# Patient Record
Sex: Female | Born: 1940 | Race: White | Hispanic: No | Smoking: Never smoker
Health system: Southern US, Community
[De-identification: ages and names within clinical notes are randomized; demographics above are authoritative.]

## PROBLEM LIST (undated history)

## (undated) DIAGNOSIS — F32A Depression, unspecified: Secondary | ICD-10-CM

## (undated) DIAGNOSIS — N83209 Unspecified ovarian cyst, unspecified side: Secondary | ICD-10-CM

## (undated) DIAGNOSIS — I420 Dilated cardiomyopathy: Secondary | ICD-10-CM

## (undated) DIAGNOSIS — K5792 Diverticulitis of intestine, part unspecified, without perforation or abscess without bleeding: Secondary | ICD-10-CM

## (undated) DIAGNOSIS — T148XXA Other injury of unspecified body region, initial encounter: Secondary | ICD-10-CM

## (undated) DIAGNOSIS — I499 Cardiac arrhythmia, unspecified: Secondary | ICD-10-CM

## (undated) DIAGNOSIS — I517 Cardiomegaly: Secondary | ICD-10-CM

## (undated) DIAGNOSIS — I878 Other specified disorders of veins: Secondary | ICD-10-CM

## (undated) DIAGNOSIS — E785 Hyperlipidemia, unspecified: Secondary | ICD-10-CM

## (undated) DIAGNOSIS — M810 Age-related osteoporosis without current pathological fracture: Secondary | ICD-10-CM

## (undated) DIAGNOSIS — D649 Anemia, unspecified: Secondary | ICD-10-CM

## (undated) DIAGNOSIS — Z974 Presence of external hearing-aid: Secondary | ICD-10-CM

## (undated) DIAGNOSIS — M4800 Spinal stenosis, site unspecified: Secondary | ICD-10-CM

## (undated) DIAGNOSIS — F329 Major depressive disorder, single episode, unspecified: Secondary | ICD-10-CM

## (undated) DIAGNOSIS — I10 Essential (primary) hypertension: Secondary | ICD-10-CM

## (undated) DIAGNOSIS — K529 Noninfective gastroenteritis and colitis, unspecified: Secondary | ICD-10-CM

## (undated) DIAGNOSIS — Z95810 Presence of automatic (implantable) cardiac defibrillator: Secondary | ICD-10-CM

## (undated) DIAGNOSIS — K219 Gastro-esophageal reflux disease without esophagitis: Secondary | ICD-10-CM

## (undated) DIAGNOSIS — E010 Iodine-deficiency related diffuse (endemic) goiter: Secondary | ICD-10-CM

## (undated) DIAGNOSIS — M1990 Unspecified osteoarthritis, unspecified site: Secondary | ICD-10-CM

## (undated) HISTORY — PX: PACEMAKER PLACEMENT: SHX43

## (undated) HISTORY — PX: CARDIAC DEFIBRILLATOR PLACEMENT: SHX171

---

## 2003-09-14 ENCOUNTER — Other Ambulatory Visit: Payer: Self-pay

## 2005-03-19 ENCOUNTER — Ambulatory Visit: Payer: Self-pay | Admitting: Internal Medicine

## 2006-04-23 ENCOUNTER — Ambulatory Visit: Payer: Self-pay | Admitting: Internal Medicine

## 2006-06-22 ENCOUNTER — Other Ambulatory Visit: Payer: Self-pay

## 2006-06-22 ENCOUNTER — Emergency Department: Payer: Self-pay | Admitting: Emergency Medicine

## 2007-03-06 ENCOUNTER — Ambulatory Visit: Payer: Self-pay | Admitting: Internal Medicine

## 2007-04-25 ENCOUNTER — Ambulatory Visit: Payer: Self-pay | Admitting: Internal Medicine

## 2008-02-06 ENCOUNTER — Emergency Department: Payer: Self-pay | Admitting: Emergency Medicine

## 2008-02-06 ENCOUNTER — Other Ambulatory Visit: Payer: Self-pay

## 2008-03-19 ENCOUNTER — Ambulatory Visit: Payer: Self-pay | Admitting: Internal Medicine

## 2008-04-27 ENCOUNTER — Ambulatory Visit: Payer: Self-pay | Admitting: Internal Medicine

## 2009-04-29 ENCOUNTER — Ambulatory Visit: Payer: Self-pay | Admitting: Internal Medicine

## 2009-09-06 ENCOUNTER — Encounter: Admission: RE | Admit: 2009-09-06 | Discharge: 2009-09-06 | Payer: Self-pay | Admitting: Orthopedic Surgery

## 2009-09-30 ENCOUNTER — Ambulatory Visit: Payer: Self-pay | Admitting: Internal Medicine

## 2010-05-02 ENCOUNTER — Ambulatory Visit: Payer: Self-pay | Admitting: Internal Medicine

## 2010-05-22 ENCOUNTER — Ambulatory Visit: Payer: Self-pay | Admitting: Cardiology

## 2010-05-26 ENCOUNTER — Ambulatory Visit: Payer: Self-pay | Admitting: Cardiology

## 2011-05-04 ENCOUNTER — Ambulatory Visit: Payer: Self-pay | Admitting: Internal Medicine

## 2011-06-21 ENCOUNTER — Ambulatory Visit: Payer: Self-pay | Admitting: Internal Medicine

## 2012-02-01 ENCOUNTER — Ambulatory Visit: Payer: Self-pay | Admitting: Internal Medicine

## 2012-02-01 LAB — CBC CANCER CENTER
Basophil %: 1.1 %
Comment - H1-Com1: NORMAL
Comment - H1-Com2: NORMAL
Eosinophil: 4 %
HCT: 31.4 % — ABNORMAL LOW (ref 35.0–47.0)
Lymphocyte #: 0.9 x10 3/mm — ABNORMAL LOW (ref 1.0–3.6)
Lymphocyte %: 21 %
MCHC: 33.8 g/dL (ref 32.0–36.0)
Monocyte #: 0.6 x10 3/mm (ref 0.2–0.9)
Neutrophil #: 2.6 x10 3/mm (ref 1.4–6.5)
Neutrophil %: 61.2 %
Platelet: 219 x10 3/mm (ref 150–440)
RDW: 13.6 % (ref 11.5–14.5)
Segmented Neutrophils: 59 %

## 2012-02-01 LAB — FOLATE: Folic Acid: 33.9 ng/mL (ref 3.1–100.0)

## 2012-02-01 LAB — RETICULOCYTES
Absolute Retic Count: 0.0329 10*6/uL (ref 0.024–0.084)
Reticulocyte: 0.97 % (ref 0.5–1.5)

## 2012-02-01 LAB — LACTATE DEHYDROGENASE: LDH: 296 U/L — ABNORMAL HIGH (ref 84–246)

## 2012-02-01 LAB — CREATININE, SERUM
Creatinine: 1.22 mg/dL (ref 0.60–1.30)
EGFR (African American): 51 — ABNORMAL LOW
EGFR (Non-African Amer.): 44 — ABNORMAL LOW

## 2012-02-04 LAB — URINE IEP, RANDOM

## 2012-02-04 LAB — PROT IMMUNOELECTROPHORES(ARMC)

## 2012-02-13 ENCOUNTER — Ambulatory Visit: Payer: Self-pay | Admitting: Internal Medicine

## 2012-03-15 ENCOUNTER — Ambulatory Visit: Payer: Self-pay | Admitting: Internal Medicine

## 2012-05-16 ENCOUNTER — Ambulatory Visit: Payer: Self-pay | Admitting: Internal Medicine

## 2013-05-18 ENCOUNTER — Ambulatory Visit: Payer: Self-pay | Admitting: Internal Medicine

## 2013-06-18 ENCOUNTER — Emergency Department: Payer: Self-pay | Admitting: Emergency Medicine

## 2013-06-18 LAB — COMPREHENSIVE METABOLIC PANEL
Alkaline Phosphatase: 64 U/L (ref 50–136)
Anion Gap: 6 — ABNORMAL LOW (ref 7–16)
BUN: 14 mg/dL (ref 7–18)
Calcium, Total: 8.7 mg/dL (ref 8.5–10.1)
Co2: 25 mmol/L (ref 21–32)
Creatinine: 1.42 mg/dL — ABNORMAL HIGH (ref 0.60–1.30)
EGFR (African American): 42 — ABNORMAL LOW
Glucose: 116 mg/dL — ABNORMAL HIGH (ref 65–99)
Potassium: 3.9 mmol/L (ref 3.5–5.1)
Sodium: 131 mmol/L — ABNORMAL LOW (ref 136–145)
Total Protein: 6.4 g/dL (ref 6.4–8.2)

## 2013-06-18 LAB — CBC
MCH: 30.6 pg (ref 26.0–34.0)
MCHC: 34.8 g/dL (ref 32.0–36.0)
Platelet: 210 10*3/uL (ref 150–440)
WBC: 4.7 10*3/uL (ref 3.6–11.0)

## 2013-06-18 LAB — URINALYSIS, COMPLETE
Bacteria: NONE SEEN
Blood: NEGATIVE
Nitrite: NEGATIVE
Ph: 5 (ref 4.5–8.0)
RBC,UR: 3 /HPF (ref 0–5)
Specific Gravity: 1.012 (ref 1.003–1.030)

## 2013-06-20 ENCOUNTER — Inpatient Hospital Stay: Payer: Self-pay | Admitting: Internal Medicine

## 2013-06-20 LAB — CBC WITH DIFFERENTIAL/PLATELET
Basophil #: 0 10*3/uL (ref 0.0–0.1)
Eosinophil #: 0.1 10*3/uL (ref 0.0–0.7)
Eosinophil %: 1.6 %
HCT: 29.3 % — ABNORMAL LOW (ref 35.0–47.0)
HGB: 10.1 g/dL — ABNORMAL LOW (ref 12.0–16.0)
Lymphocyte %: 7.8 %
MCH: 30.2 pg (ref 26.0–34.0)
MCV: 88 fL (ref 80–100)
Monocyte #: 0.9 x10 3/mm (ref 0.2–0.9)
Neutrophil %: 77.5 %
RBC: 3.33 10*6/uL — ABNORMAL LOW (ref 3.80–5.20)

## 2013-06-20 LAB — URINALYSIS, COMPLETE
Bilirubin,UR: NEGATIVE
Glucose,UR: NEGATIVE mg/dL (ref 0–75)
Nitrite: NEGATIVE
Protein: NEGATIVE
Specific Gravity: 1.002 (ref 1.003–1.030)

## 2013-06-20 LAB — COMPREHENSIVE METABOLIC PANEL
Albumin: 2.4 g/dL — ABNORMAL LOW (ref 3.4–5.0)
Alkaline Phosphatase: 67 U/L (ref 50–136)
BUN: 8 mg/dL (ref 7–18)
Bilirubin,Total: 0.3 mg/dL (ref 0.2–1.0)
EGFR (African American): 42 — ABNORMAL LOW
EGFR (Non-African Amer.): 37 — ABNORMAL LOW
Glucose: 99 mg/dL (ref 65–99)
Potassium: 3.5 mmol/L (ref 3.5–5.1)
SGOT(AST): 15 U/L (ref 15–37)
SGPT (ALT): 11 U/L — ABNORMAL LOW (ref 12–78)

## 2013-06-21 LAB — CBC WITH DIFFERENTIAL/PLATELET
Basophil #: 0 10*3/uL (ref 0.0–0.1)
Basophil %: 0.2 %
Eosinophil #: 0.1 10*3/uL (ref 0.0–0.7)
Eosinophil %: 1.6 %
HCT: 26.4 % — ABNORMAL LOW (ref 35.0–47.0)
Lymphocyte #: 1 10*3/uL (ref 1.0–3.6)
MCH: 30.7 pg (ref 26.0–34.0)
MCHC: 35.2 g/dL (ref 32.0–36.0)
MCV: 87 fL (ref 80–100)
Monocyte #: 1 x10 3/mm — ABNORMAL HIGH (ref 0.2–0.9)
Neutrophil %: 69.6 %
RDW: 12.7 % (ref 11.5–14.5)

## 2013-06-21 LAB — BASIC METABOLIC PANEL
Anion Gap: 7 (ref 7–16)
Chloride: 103 mmol/L (ref 98–107)
Co2: 23 mmol/L (ref 21–32)
Potassium: 3.8 mmol/L (ref 3.5–5.1)
Sodium: 133 mmol/L — ABNORMAL LOW (ref 136–145)

## 2013-06-21 LAB — LIPID PANEL
Cholesterol: 83 mg/dL (ref 0–200)
Ldl Cholesterol, Calc: 26 mg/dL (ref 0–100)
Triglycerides: 54 mg/dL (ref 0–200)

## 2013-06-22 LAB — COMPREHENSIVE METABOLIC PANEL
Albumin: 2 g/dL — ABNORMAL LOW (ref 3.4–5.0)
BUN: 4 mg/dL — ABNORMAL LOW (ref 7–18)
Calcium, Total: 7.7 mg/dL — ABNORMAL LOW (ref 8.5–10.1)
Chloride: 106 mmol/L (ref 98–107)
Glucose: 125 mg/dL — ABNORMAL HIGH (ref 65–99)

## 2013-06-22 LAB — CBC WITH DIFFERENTIAL/PLATELET
Basophil #: 0 10*3/uL (ref 0.0–0.1)
Eosinophil #: 0.2 10*3/uL (ref 0.0–0.7)
Eosinophil %: 3.9 %
Lymphocyte %: 13.5 %
MCHC: 34.5 g/dL (ref 32.0–36.0)
RDW: 13.1 % (ref 11.5–14.5)
WBC: 6 10*3/uL (ref 3.6–11.0)

## 2013-06-22 LAB — PHOSPHORUS: Phosphorus: 1.4 mg/dL — ABNORMAL LOW (ref 2.5–4.9)

## 2013-06-23 LAB — COMPREHENSIVE METABOLIC PANEL
Alkaline Phosphatase: 56 U/L (ref 50–136)
Anion Gap: 7 (ref 7–16)
EGFR (African American): >60
Glucose: 110 mg/dL — ABNORMAL HIGH (ref 65–99)
Potassium: 3.6 mmol/L (ref 3.5–5.1)
SGOT(AST): 16 U/L (ref 15–37)
Sodium: 136 mmol/L (ref 136–145)

## 2013-06-23 LAB — CBC WITH DIFFERENTIAL/PLATELET
Basophil #: 0 10*3/uL (ref 0.0–0.1)
Basophil %: 0.5 %
Eosinophil #: 0.2 10*3/uL (ref 0.0–0.7)
Eosinophil %: 3.1 %
HGB: 9.7 g/dL — ABNORMAL LOW (ref 12.0–16.0)
MCH: 30.6 pg (ref 26.0–34.0)
Neutrophil %: 67.2 %
Platelet: 253 10*3/uL (ref 150–440)
RDW: 13.1 % (ref 11.5–14.5)

## 2013-06-23 LAB — STOOL CULTURE

## 2013-06-24 LAB — CBC WITH DIFFERENTIAL/PLATELET
Basophil %: 0.5 %
HCT: 29.8 % — ABNORMAL LOW (ref 35.0–47.0)
HGB: 10.2 g/dL — ABNORMAL LOW (ref 12.0–16.0)
Monocyte #: 1 x10 3/mm — ABNORMAL HIGH (ref 0.2–0.9)
Monocyte %: 10.6 %
Neutrophil %: 67.7 %
Platelet: 285 10*3/uL (ref 150–440)
RDW: 13 % (ref 11.5–14.5)
WBC: 9 10*3/uL (ref 3.6–11.0)

## 2013-06-24 LAB — COMPREHENSIVE METABOLIC PANEL
Bilirubin,Total: 0.2 mg/dL (ref 0.2–1.0)
Co2: 25 mmol/L (ref 21–32)
EGFR (African American): >60
Glucose: 112 mg/dL — ABNORMAL HIGH (ref 65–99)
Osmolality: 273 (ref 275–301)
Potassium: 3.7 mmol/L (ref 3.5–5.1)
SGOT(AST): 16 U/L (ref 15–37)
Sodium: 137 mmol/L (ref 136–145)

## 2013-06-24 LAB — MAGNESIUM: Magnesium: 2.3 mg/dL

## 2013-06-25 LAB — COMPREHENSIVE METABOLIC PANEL
Albumin: 2.2 g/dL — ABNORMAL LOW (ref 3.4–5.0)
Alkaline Phosphatase: 55 U/L (ref 50–136)
Anion Gap: 5 — ABNORMAL LOW (ref 7–16)
BUN: 13 mg/dL (ref 7–18)
Bilirubin,Total: 0.2 mg/dL (ref 0.2–1.0)
Chloride: 103 mmol/L (ref 98–107)
Co2: 27 mmol/L (ref 21–32)
Creatinine: 0.98 mg/dL (ref 0.60–1.30)
EGFR (African American): >60
Glucose: 109 mg/dL — ABNORMAL HIGH (ref 65–99)
Osmolality: 271 (ref 275–301)
SGOT(AST): 23 U/L (ref 15–37)

## 2013-06-25 LAB — CBC WITH DIFFERENTIAL/PLATELET
Basophil #: 0 10*3/uL (ref 0.0–0.1)
Basophil %: 0.3 %
Eosinophil %: 5.4 %
HCT: 29.7 % — ABNORMAL LOW (ref 35.0–47.0)
HGB: 10.3 g/dL — ABNORMAL LOW (ref 12.0–16.0)
MCH: 30.3 pg (ref 26.0–34.0)
MCHC: 34.7 g/dL (ref 32.0–36.0)
Neutrophil #: 6.8 10*3/uL — ABNORMAL HIGH (ref 1.4–6.5)
Neutrophil %: 67.2 %

## 2013-06-25 LAB — CULTURE, BLOOD (SINGLE)

## 2013-06-26 LAB — COMPREHENSIVE METABOLIC PANEL
Albumin: 2.2 g/dL — ABNORMAL LOW (ref 3.4–5.0)
Alkaline Phosphatase: 44 U/L — ABNORMAL LOW (ref 50–136)
Anion Gap: 6 — ABNORMAL LOW (ref 7–16)
Calcium, Total: 8.5 mg/dL (ref 8.5–10.1)
Co2: 25 mmol/L (ref 21–32)
Creatinine: 1.02 mg/dL (ref 0.60–1.30)
EGFR (African American): >60
EGFR (Non-African Amer.): 54 — ABNORMAL LOW
Glucose: 97 mg/dL (ref 65–99)
Potassium: 3.9 mmol/L (ref 3.5–5.1)
SGPT (ALT): 17 U/L (ref 12–78)
Sodium: 134 mmol/L — ABNORMAL LOW (ref 136–145)

## 2013-06-26 LAB — CBC WITH DIFFERENTIAL/PLATELET
Basophil #: 0 10*3/uL (ref 0.0–0.1)
Basophil %: 0.5 %
HCT: 28.4 % — ABNORMAL LOW (ref 35.0–47.0)
HGB: 9.8 g/dL — ABNORMAL LOW (ref 12.0–16.0)
Lymphocyte %: 17.3 %
Monocyte #: 0.9 x10 3/mm (ref 0.2–0.9)
Monocyte %: 9.6 %

## 2013-06-26 LAB — MAGNESIUM: Magnesium: 1.9 mg/dL

## 2013-07-16 ENCOUNTER — Ambulatory Visit: Payer: Self-pay | Admitting: Internal Medicine

## 2013-07-28 ENCOUNTER — Ambulatory Visit: Payer: Self-pay | Admitting: Gastroenterology

## 2013-08-09 ENCOUNTER — Emergency Department: Payer: Self-pay | Admitting: Emergency Medicine

## 2013-08-09 LAB — COMPREHENSIVE METABOLIC PANEL
Albumin: 3.4 g/dL (ref 3.4–5.0)
Alkaline Phosphatase: 88 U/L (ref 50–136)
BUN: 13 mg/dL (ref 7–18)
Bilirubin,Total: 0.4 mg/dL (ref 0.2–1.0)
Calcium, Total: 8.6 mg/dL (ref 8.5–10.1)
Creatinine: 1.27 mg/dL (ref 0.60–1.30)
EGFR (African American): 48 — ABNORMAL LOW
EGFR (Non-African Amer.): 42 — ABNORMAL LOW
Glucose: 103 mg/dL — ABNORMAL HIGH (ref 65–99)
Osmolality: 269 (ref 275–301)
SGOT(AST): 26 U/L (ref 15–37)
SGPT (ALT): 22 U/L (ref 12–78)
Sodium: 134 mmol/L — ABNORMAL LOW (ref 136–145)
Total Protein: 6.5 g/dL (ref 6.4–8.2)

## 2013-08-09 LAB — CBC
HGB: 10.7 g/dL — ABNORMAL LOW (ref 12.0–16.0)
MCV: 90 fL (ref 80–100)
Platelet: 240 10*3/uL (ref 150–440)
RDW: 15 % — ABNORMAL HIGH (ref 11.5–14.5)
WBC: 3.6 10*3/uL (ref 3.6–11.0)

## 2013-08-10 LAB — URINALYSIS, COMPLETE
Glucose,UR: NEGATIVE mg/dL (ref 0–75)
Ketone: NEGATIVE
Ph: 6 (ref 4.5–8.0)
Protein: NEGATIVE
Specific Gravity: 1.002 (ref 1.003–1.030)
WBC UR: <1 /HPF (ref 0–5)

## 2013-08-10 LAB — TROPONIN I: Troponin-I: <0.02 ng/mL

## 2013-08-28 ENCOUNTER — Ambulatory Visit: Payer: Self-pay | Admitting: Gastroenterology

## 2013-10-01 ENCOUNTER — Ambulatory Visit: Payer: Self-pay | Admitting: Gastroenterology

## 2014-05-21 ENCOUNTER — Ambulatory Visit: Payer: Self-pay | Admitting: Internal Medicine

## 2014-07-04 ENCOUNTER — Emergency Department: Payer: Self-pay | Admitting: Emergency Medicine

## 2015-02-04 NOTE — Discharge Summary (Signed)
PATIENT NAME:  Michele Montgomery, Michele Montgomery MR#:  [id number] DATE OF BIRTH:  01/27/1941  DATE OF ADMISSION:  06/20/2013 DATE OF DISCHARGE:  06/26/2013  REASON FOR ADMISSION: Diarrhea and vomiting.   HISTORY OF PRESENT ILLNESS: Please see the dictated HPI done by Dr. Anselm Jungling on 06/20/2013.   PAST MEDICAL HISTORY:  1. Benign hypertension.  2. Hyperlipidemia.  3. Chronic renal failure.  4. Dilated cardiomyopathy.  5. Status post defibrillator placement.  6. Hyperlipidemia.   MEDICATIONS ON ADMISSION: Please see admission note.   ALLERGIES: PENICILLIN, PREDNISONE AND LEVAQUIN.   SOCIAL HISTORY, FAMILY HISTORY AND REVIEW OF SYSTEMS: As per admission note.   PHYSICAL EXAMINATION:  GENERAL: The patient was in no acute distress.  VITAL SIGNS: Initially remarkable for a blood pressure of 220/86, which improved to 163/84 in the Emergency Room.  HEENT EXAM: Unremarkable.  NECK: Supple without JVD.  LUNGS: Clear.  CARDIAC: Regular rate and rhythm. Normal S1, S2.  ABDOMEN: Soft and nontender.  EXTREMITIES: Without edema.  NEUROLOGIC EXAM: Grossly nonfocal.   HOSPITAL COURSE: The patient was admitted with colitis on CT, associated with nausea, vomiting, diarrhea and anorexia. She was initially started on IV antibiotics. Blood cultures were sent. She was seen in consultation by GI, who recommended conservative therapy. The patient remained symptomatic. Colonoscopy/sigmoidoscopy were scheduled, but then the patient's symptoms improved. Colonoscopy was not performed. Her nausea, vomiting and diarrhea improved with conservative therapy. She was switched to oral medications, and her diet was advanced. She tolerated this well. The patient was stable and ready for discharge on 06/26/2013.   DISCHARGE DIAGNOSES:  1. Colitis.  2. Intractable nausea, vomiting with diarrhea, resolved.  3. Anemia.  4. Dilated cardiomyopathy, status post defibrillator placement.  5. Chronic anemia.  6. Benign hypertension.  7.  Hyperlipidemia.   DISCHARGE MEDICATIONS:  1. Iron 65 mg p.o. daily.  2. Altace 10 mg p.o. daily.  3. Loperamide 2 mg p.o. q.i.d. p.r.n. diarrhea.  4. Reglan 5 mg p.o. t.i.d. a.c.  5. Phenergan 25 mg p.o. q.4-6 hours p.r.n. nausea and vomiting.  6. Zyrtec 10 mg p.o. daily.  7. Zocor 40 mg p.o. at bedtime.  8. Aspirin 81 mg p.o. daily.  9. Lorazepam 0.5 mg p.o. q.4 hours p.r.n. anxiety.  10. Ambien 5 mg p.o. at bedtime p.r.n. sleep.  11. Coreg 25 mg p.o. b.i.d.  12. Protonix 40 mg p.o. b.i.d.   FOLLOWUP PLANS AND APPOINTMENTS: The patient was discharged on a bland diet with Ensure clear supplements. She will follow up with me within 1 week's time, sooner if needed.   ____________________________ Leonie Douglas Doy Hutching, MD jds:OSi D: 07/09/2013 11:51:39 ET T: 07/09/2013 12:35:33 ET JOB#: [id number]  cc: Leonie Douglas. Doy Hutching, MD, <Dictator> Danaysia Rader Lennice Sites MD ELECTRONICALLY SIGNED 07/09/2013 13:11

## 2015-02-04 NOTE — Consult Note (Signed)
PATIENT NAME:  GUSTAVO, MEDITZ MR#:  [id number] DATE OF BIRTH:  11-18-1940  DATE OF CONSULTATION:  06/21/2013  CONSULTING PHYSICIAN:  Arther Dames, MD  REFERRING PHYSICIAN:  Dr. Vaughan Basta  REASON FOR CONSULT:  Diarrhea, nausea, vomiting.   HISTORY OF PRESENT ILLNESS:  Ms. Schwier is a 74 year old female with a history notable for congestive heart failure, EF of less than 30%, hypertension, chronic renal failure, who is presenting to the hospital for ongoing diarrhea and nausea. Ms. Otoole was also admitted last week for similar symptoms. She had a CT scan, which suggested colitis, and she was started on Flagyl. However, she has not been able to tolerate Flagyl due to nausea and vomiting, and has also continued to have diarrhea. She is now re-presenting to the hospital for  the same symptoms.   She reports about 10 episodes per day of diarrhea. The diarrhea is green in color. There is no blood in there. No black contents. She has not had any similar diarrhea episodes in over 10 years. This is all completely new for her. She does not know of any sick contacts. No recent travel. She has not yet been tested for C. diff, ova and parasites, or stool culture.   PAST MEDICAL HISTORY: 1.  Severe cardiomyopathy, EF less than 30%, status post AICD.   2.  Hypertension.  3.  Hyperlipidemia.  4.  Chronic renal failure.   SURGICAL HISTORY:  None.   SOCIAL HISTORY:  She denies any tobacco, alcohol, or recreational drugs.    FAMILY HISTORY:  She has no family history of colon cancer or gastric cancer that she is aware of.   ALLERGIES:  PENICILLIN, PREDNISONE, LEVAQUIN.  REVIEW OF SYSTEMS:   CONSTITUTIONAL: No weight gain or weight loss.  No fever or chills. Positive for weakness and fatigue. HEENT: No oral lesions or sore throat. No vision changes. GASTROINTESTINAL: See HPI.  HEME/LYMPH: No easy bruising or bleeding. CARDIOVASCULAR: No chest pain or dyspnea on  exertion. GENITOURINARY: No hematuria. INTEGUMENTARY: No rashes or pruritus PSYCHIATRIC: No depression/anxiety.  ENDOCRINE: No heat/cold intolerance, no hair loss or skin changes. ALLERGIC/IMMUNOLOGIC: Negative for hives. RESPIRATORY: No cough, no shortness of breath.  MUSCULOSKELETAL: No joint swelling or muscle pain.  VITAL SIGNS:  Temperature is 98.7, pulse is 74, respirations are 18, blood pressure is 99/59, Pulse ox is 93% on room air.   PHYSICAL EXAMINATION: GENERAL: Alert and oriented times 4.  No acute distress. Appears stated age. HEENT: Normocephalic/atraumatic. Extraocular movements are intact. Anicteric. NECK: Soft, supple. JVP appears normal. No adenopathy. CHEST: Clear to auscultation. No wheeze or crackle. Respirations unlabored. AICD on the upper left chest wall. HEART: Regular. No murmur, rub, or gallop.  Normal S1 and S2. ABDOMEN: Soft, nondistended.  Normal active bowel sounds in all four quadrants.  No organomegaly. No masses. Mild diffuse tenderness. EXTREMITIES: No swelling, well perfused. SKIN: No rash or lesion. Skin color, texture, turgor normal. NEUROLOGICAL: Grossly intact. PSYCHIATRIC: Normal tone and affect. MUSCULOSKELETAL: No joint swelling or erythema.   DATA:  Sodium 133, potassium 3.8, chloride 103, bicarb 23, BUN is 7, creatinine 1.43. Liver enzymes are normal, with the exception of albumin, which is 2.4. White count is 6.9, hemoglobin is 9.3, hematocrit is 26, platelets are 227. CT abdomen and pelvis was done on 06/21/2013, showed an improving appearance of the colon and colonic diverticulosis without evidence of acute diverticulitis. There was a CT from September 4, which showed sigmoid colonic wall thickening and diverticulosis. Adjacent inflammatory change.  These findings were thought to be most consistent with diverticulitis.   ASSESSMENT AND PLAN:  Diarrhea, nausea:  This  seems to be an acute onset of the diarrhea. I do not see that stool studies  have yet been sent. I am suspicious that this may, in fact, be Clostridium difficile. In addition, the thickening in the colon is nonspecific. Certainly, an infection could cause this appearance. In addition, I would not expect diverticulitis to cause diarrhea, so I do not think that is the cause of the thickening. I will plan at this time to go ahead with a stool culture, ova and parasites, and a C. diff. If these studies are negative, the next study would be to get some  random biopsies of the colon. However, it seems that she has a very difficult sigmoid colon due to the diverticulosis. We could attempt to obtain very distal biopsies if the stool studies are nondiagnostic. Further, we also can send some additional tests if the stool studies are nondiagnostic, but will await for these before enlarging the workup. In the meantime, please continue supportive care with IV fluids and repleting electrolytes as you are doing. Thank you for this consult, and we will continue to follow. Also, please give Zofran for the nausea, as you are doing.    ____________________________ Arther Dames, MD mr:mr D: 06/21/2013 16:06:00 ET T: 06/21/2013 18:55:16 ET JOB#: [id number]  cc: Arther Dames, MD, <Dictator> Mellody Life MD ELECTRONICALLY SIGNED 06/24/2013 19:20

## 2015-02-04 NOTE — Consult Note (Signed)
Chief Complaint:  Subjective/Chief Complaint Continues to feel better. IV removed. Switched to oral meds.   VITAL SIGNS/ANCILLARY NOTES: **Vital Signs.:   11-Sep-14 08:37  Vital Signs Type Pre Medication  Pulse Pulse 77  Respirations Respirations 20  Systolic BP Systolic BP 032  Diastolic BP (mmHg) Diastolic BP (mmHg) 78  Mean BP 110  Pulse Ox % Pulse Ox % 95  Pulse Ox Activity Level  At rest  Oxygen Delivery Room Air/ 21 %   Brief Assessment:  GEN no acute distress   Cardiac Regular   Respiratory clear BS   Gastrointestinal Normal   Lab Results: Hepatic:  11-Sep-14 04:20   Bilirubin, Total 0.2  Alkaline Phosphatase 55  SGPT (ALT) 14  SGOT (AST) 23  Total Protein, Serum  5.5  Albumin, Serum  2.2  Routine Chem:  11-Sep-14 04:20   Glucose, Serum  109  BUN 13  Creatinine (comp) 0.98  Sodium, Serum  135  Potassium, Serum 3.9  Chloride, Serum 103  CO2, Serum 27  Calcium (Total), Serum 8.5  Osmolality (calc) 271  eGFR (African American) >60  eGFR (Non-African American)  57 (eGFR values <17m/min/1.73 m2 may be an indication of chronic kidney disease (CKD). Calculated eGFR is useful in patients with stable renal function. The eGFR calculation will not be reliable in acutely ill patients when serum creatinine is changing rapidly. It is not useful in  patients on dialysis. The eGFR calculation may not be applicable to patients at the low and high extremes of body sizes, pregnant women, and vegetarians.)  Anion Gap  5  Routine Hem:  11-Sep-14 04:20   WBC (CBC) 10.1  RBC (CBC)  3.41  Hemoglobin (CBC)  10.3  Hematocrit (CBC)  29.7  Platelet Count (CBC) 295  MCV 87  MCH 30.3  MCHC 34.7  RDW 13.3  Neutrophil % 67.2  Lymphocyte % 17.5  Monocyte % 9.6  Eosinophil % 5.4  Basophil % 0.3  Neutrophil #  6.8  Lymphocyte # 1.8  Monocyte #  1.0  Eosinophil # 0.5  Basophil # 0.0 (Result(s) reported on 25 Jun 2013 at 05:19AM.)   Assessment/Plan:   Assessment/Plan:  Assessment Acute diarrhea. Improving.   Plan Possible discharge tomorow. Finish Abx course. Dr. RRayann Hemanwill see patient tomorrow if patient needs longer stay. Thanks.   Electronic Signatures: OVerdie Shire(MD)  (Signed 11-Sep-14 13:00)  Authored: Chief Complaint, VITAL SIGNS/ANCILLARY NOTES, Brief Assessment, Lab Results, Assessment/Plan   Last Updated: 11-Sep-14 13:00 by OVerdie Shire(MD)

## 2015-02-04 NOTE — Consult Note (Signed)
Brief Consult Note: Diagnosis: diarrhea.   Patient was seen by consultant.   Consult note dictated.   Recommend further assessment or treatment.   Orders entered.   Comments: acute diarrhea.   stool studies pending.   If c.diff negative, will start imodium.  May require flex sig if stool studies negative and no improvement with imodium.  Electronic Signatures: Arther Dames (MD)  (Signed 07-Sep-14 20:38)  Authored: Brief Consult Note   Last Updated: 07-Sep-14 20:38 by Arther Dames (MD)

## 2015-02-04 NOTE — H&P (Signed)
PATIENT NAME:  Michele Montgomery, Michele Montgomery MR#:  [id number] DATE OF BIRTH:  Oct 19, 1940  DATE OF ADMISSION:  06/20/2013  PRIMARY CARE PHYSICIAN:  Dr. Fulton Reek.  REFERRING EMERGENCY ROOM PHYSICIAN:  Dr. Glynda Jaeger.  CHIEF COMPLAINT:  Diarrhea and vomiting.   HISTORY OF PRESENT ILLNESS:  The patient is a 74 year old female with past medical history of hypertension, hyperlipidemia, and dilated cardiomyopathy, chronic renal failure, who was in her usual state of health till 5 days ago. She started having diarrhea and nausea 5 to 6 days ago, which was interrupting in her day-to-day life, so she went to her primary care physician, Dr. Doy Hutching in office 3 days ago, and Dr. Doy Hutching prescribed oral Flagyl, but the patient could not tolerated it very well, and her complaint continued so she came to the Emergency Room the next day and the ER physician added oral Bactrim also to that and sent her home after doing CAT scan, which was suggestive of colitis. For the last two days the patient was trying to take this oral antibiotic medication, but that was making her severely nauseous and she was not able to keep any medicine in her stomach and having vomiting, so she decided to come to the Emergency Room. On further questioning today, the patient denies any fever, but she says she has chills, denies any abdominal pain, has vomiting and diarrhea, but did not notice any blood in the stool or in the vomit or denies any dark-colored stool, either. The patient is being admitted for failure of oral therapy due to severe vomiting for colitis.   REVIEW OF SYSTEMS:  CONSTITUTIONAL:  Negative for fever, fatigue, weakness, pain or weight loss.  EYES:  No blurring, double vision, pain or redness.  EARS, NOSE, THROAT:  No tinnitus, ear pain, hearing loss or bleed from the nose.  RESPIRATORY:  No cough, wheezing, hemoptysis, shortness of breath.  CARDIOVASCULAR:  No chest pain, orthopnea, edema, arrhythmia or palpitations.   GASTROINTESTINAL:  Had nausea, vomiting and diarrhea, but no abdominal pain and did not notice any blood in the vomit or stool.  GENITOURINARY:  No dysuria, hematuria or increased frequency of urination.  ENDOCRINE:  No increased sweating. No heat or cold intolerance.  SKIN:  No acne or rashes or lesions on the skin.  MUSCULOSKELETAL:  No pain or swelling in the joints.  NEUROLOGICAL:  No numbness, weakness, tremors or vertigo.  PSYCHIATRIC:  No anxiety, insomnia or bipolar disorder.   PAST MEDICAL HISTORY: 1.  Severe cardiomyopathy, ejection fraction less than 30%, status post AICD.  2.  Hypertension.  3.  High cholesterol.  4.  Chronic renal failure.   PAST SURGICAL HISTORY:  None.   SOCIAL HISTORY:  She lives with family and living independent life. 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 Denies any smoking, drinking alcohol or doing any drugs.   FAMILY HISTORY:  Mother is 48 year old and very healthy, active, walking without any support. Her mother had heart problems, a CABG was done recently.   ALLERGIES:  The patient is ALLERGIC TO PENICILLIN, severe reactions, which had stopped her breathing. Other allergies with PREDNISONE. She has some breaking out but with [facility] she is not sure, that it was just making her sick and stomach upset.   PHYSICAL EXAMINATION:  VITAL SIGNS:  In ER, temperature 98.6, pulse 83, and respirations 20. On presentation in the ER, the blood pressure was 221/86, which currently came down to 163/84, pulse ox 96 on  room air.  GENERAL:  The patient is fully alert and oriented to time, place and person and does not appear in any acute distress.  HEENT:  Head and neck atraumatic. Conjunctivae pink. Oral mucosa moist.  NECK:  Supple. No JVD.  RESPIRATORY:  Bilateral clear and equal air entry.  CARDIOVASCULAR:  S1, S2 present, regular. No murmur present. There is AICD present on left chest wall. No local tenderness.  ABDOMEN:  Soft, nontender.  Bowel sounds present. No organomegaly.  SKIN:  No rashes.  LEGS:  No edema or swelling. JOINTS:  No swelling or tenderness.  NEUROLOGICAL:  Power 5/5. Follows commands. No tremors or rigidity.  PSYCHIATRIC:  Does not appear in any acute psychiatric illness.  LABORATORY, DIAGNOSTIC, AND RADIOLOGICAL DATA:  Glucose 99, BUN 8, creatinine 141. Her creatinine in April, 2013, was 1.22. Sodium 132, potassium 3.5, chloride 102 and CO2 23. Calcium is 8.3. Total protein 5.8, albumin 2.4, bilirubin 0.3, alkaline phosphatase 67, SGOT 15 and SGPT 11. WBC 6.9, hemoglobin 10.1, platelet count 218, and MCV is 86. Urinalysis on September 4 was grossly negative. CT of the abdomen, which was done on September 4 showed colitis and diverticulitis cannot be excluded in the sigmoid colon.   CHEST X-RAY:  No acute cardiopulmonary disease on September 4.   ASSESSMENT AND PLAN:  A 74 year old female with past history of cardiomyopathy, automatic implantable cardiac defibrillator, hypertension, hyperlipidemia and chronic renal failure, started having diarrhea and vomiting and was given oral therapy for colitis. Could not tolerate it due to severe vomiting and came to the Emergency Room, started on IV therapy by Emergency Room physician.  1.  Colitis as evident on CT of the abdomen and possible diverticulitis, may need further workup later on in [facility]ology Clinic. We will start her on IV Bactrim and Flagyl and we will give her Zofran IV as needed for nausea and vomiting. Blood culture was also sent by Emergency Room physician.  2.  Severe cardiomyopathy. We will not do any additional IV fluid other than her IV antibiotic to prevent her on decompensating her heart failure. Currently, there are no sign of heart failure. We will continue her baseline home medications, which she was taking at home.  3.  Uncontrolled hypertension. The patient admits to not able to take any medicine for her blood pressure or heart today because  of severe vomiting and that might be the reason, added with distress to be in Emergency Room for her uncontrolled high blood pressure when she presented in the Emergency Room. Currently her blood pressure came down to acceptable range of 202 to 542 systolic. We will continue her oral medication, carvedilol, as she was taking at home.  4.  Hyperlipidemia. She was taking simvastatin at home. We will continue that here.  5.  Chronic renal failure. Her baseline creatinine was 1.2 in April 2013, currently 1.4. We will continue monitoring and hopefully now once her nausea and vomiting stops with IV medication and she is able to eat diet that should improve.  CODE STATUS:  FULL CODE.   TOTAL TIME SPENT:  50 minutes in this admission. We will speak to Dr. Fulton Reek about taking over her care.  ____________________________ Ceasar Lund. Anselm Jungling, MD vgv:jm D: 06/20/2013 11:11:46 ET T: 06/20/2013 11:40:08 ET JOB#: [id number]  cc: Ceasar Lund. Anselm Jungling, MD, <Dictator> Vaughan Basta MD ELECTRONICALLY SIGNED 06/20/2013 15:48

## 2015-02-04 NOTE — Consult Note (Signed)
Chief Complaint:  Subjective/Chief Complaint Covering for Dr. Rayann Heman. Diarrhea better. Dyspepsia from Abx? Tolerating solids so far.   VITAL SIGNS/ANCILLARY NOTES: **Vital Signs.:   10-Sep-14 09:27  Vital Signs Type Pre Medication  Temperature Temperature (F) 98.2  Celsius 36.7  Temperature Source oral  Pulse Pulse 88  Respirations Respirations 20  Systolic BP Systolic BP 486  Diastolic BP (mmHg) Diastolic BP (mmHg) 71  Mean BP 96  Pulse Ox % Pulse Ox % 95  Pulse Ox Activity Level  At rest  Oxygen Delivery Room Air/ 21 %   Brief Assessment:  GEN no acute distress   Cardiac Regular   Respiratory clear BS   Gastrointestinal Normal   Lab Results: Hepatic:  10-Sep-14 04:12   Bilirubin, Total 0.2  Alkaline Phosphatase 54  SGPT (ALT)  11  SGOT (AST) 16  Total Protein, Serum  5.3  Albumin, Serum  2.1  Routine Chem:  10-Sep-14 04:12   Phosphorus, Serum  2.2 (Result(s) reported on 24 Jun 2013 at 05:08AM.)  Magnesium, Serum 2.3 (1.8-2.4 THERAPEUTIC RANGE: 4-7 mg/dL TOXIC: > 10 mg/dL  -----------------------)  Glucose, Serum  112  BUN 9  Creatinine (comp) 0.95  Sodium, Serum 137  Potassium, Serum 3.7  Chloride, Serum 106  CO2, Serum 25  Calcium (Total), Serum  7.9  Osmolality (calc) 273  eGFR (African American) >60  eGFR (Non-African American)  59 (eGFR values <96m/min/1.73 m2 may be an indication of chronic kidney disease (CKD). Calculated eGFR is useful in patients with stable renal function. The eGFR calculation will not be reliable in acutely ill patients when serum creatinine is changing rapidly. It is not useful in  patients on dialysis. The eGFR calculation may not be applicable to patients at the low and high extremes of body sizes, pregnant women, and vegetarians.)  Anion Gap  6  Routine Hem:  10-Sep-14 04:12   WBC (CBC) 9.0  RBC (CBC)  3.44  Hemoglobin (CBC)  10.2  Hematocrit (CBC)  29.8  Platelet Count (CBC) 285  MCV 87  MCH 29.6  MCHC 34.2   RDW 13.0  Neutrophil % 67.7  Lymphocyte % 18.0  Monocyte % 10.6  Eosinophil % 3.2  Basophil % 0.5  Neutrophil # 6.1  Lymphocyte # 1.6  Monocyte #  1.0  Eosinophil # 0.3  Basophil # 0.0 (Result(s) reported on 24 Jun 2013 at 05:30AM.)   Assessment/Plan:  Assessment/Plan:  Assessment Infectious diarrhea? Clinically improving.   Plan Continue current management. Continue flagyl. Will follow. Thanks.   Electronic Signatures: OVerdie Shire(MD)  (Signed 10-Sep-14 13:28)  Authored: Chief Complaint, VITAL SIGNS/ANCILLARY NOTES, Brief Assessment, Lab Results, Assessment/Plan   Last Updated: 10-Sep-14 13:28 by OVerdie Shire(MD)

## 2015-05-03 ENCOUNTER — Other Ambulatory Visit: Payer: Self-pay | Admitting: Internal Medicine

## 2015-05-03 DIAGNOSIS — Z1231 Encounter for screening mammogram for malignant neoplasm of breast: Secondary | ICD-10-CM

## 2015-05-27 ENCOUNTER — Ambulatory Visit
Admission: RE | Admit: 2015-05-27 | Discharge: 2015-05-27 | Disposition: A | Discharge status: Home / Self Care | Payer: Medicare Other | Source: Ambulatory Visit | Attending: Internal Medicine | Admitting: Internal Medicine

## 2015-05-27 DIAGNOSIS — Z1231 Encounter for screening mammogram for malignant neoplasm of breast: Secondary | ICD-10-CM | POA: Diagnosis present

## 2015-06-09 ENCOUNTER — Other Ambulatory Visit: Payer: Self-pay | Admitting: Internal Medicine

## 2015-06-09 DIAGNOSIS — R1084 Generalized abdominal pain: Secondary | ICD-10-CM

## 2015-06-09 DIAGNOSIS — R102 Pelvic and perineal pain unspecified side: Secondary | ICD-10-CM

## 2015-06-15 ENCOUNTER — Ambulatory Visit
Admission: RE | Admit: 2015-06-15 | Discharge: 2015-06-15 | Disposition: A | Discharge status: Home / Self Care | Payer: Medicare Other | Source: Ambulatory Visit | Attending: Internal Medicine | Admitting: Internal Medicine

## 2015-06-15 DIAGNOSIS — R1084 Generalized abdominal pain: Secondary | ICD-10-CM | POA: Insufficient documentation

## 2015-06-15 DIAGNOSIS — K5790 Diverticulosis of intestine, part unspecified, without perforation or abscess without bleeding: Secondary | ICD-10-CM | POA: Insufficient documentation

## 2015-06-15 DIAGNOSIS — R102 Pelvic and perineal pain: Secondary | ICD-10-CM

## 2015-06-15 DIAGNOSIS — J90 Pleural effusion, not elsewhere classified: Secondary | ICD-10-CM | POA: Insufficient documentation

## 2015-06-15 HISTORY — DX: Essential (primary) hypertension: I10

## 2015-06-15 MED ORDER — IOHEXOL 300 MG/ML  SOLN
75.0000 mL | Freq: Once | INTRAMUSCULAR | Status: AC | PRN
  Administered 2015-06-15: 75 mL via INTRAVENOUS

## 2015-06-17 ENCOUNTER — Emergency Department
Admission: EM | Admit: 2015-06-17 | Discharge: 2015-06-18 | Disposition: A | Discharge status: Home / Self Care | Payer: Medicare Other | Attending: Emergency Medicine | Admitting: Emergency Medicine

## 2015-06-17 ENCOUNTER — Encounter: Payer: Self-pay | Admitting: Emergency Medicine

## 2015-06-17 DIAGNOSIS — E860 Dehydration: Secondary | ICD-10-CM | POA: Diagnosis not present

## 2015-06-17 DIAGNOSIS — R197 Diarrhea, unspecified: Secondary | ICD-10-CM | POA: Insufficient documentation

## 2015-06-17 DIAGNOSIS — T3695XA Adverse effect of unspecified systemic antibiotic, initial encounter: Secondary | ICD-10-CM | POA: Insufficient documentation

## 2015-06-17 DIAGNOSIS — R1032 Left lower quadrant pain: Secondary | ICD-10-CM | POA: Diagnosis not present

## 2015-06-17 DIAGNOSIS — Z880 Allergy status to penicillin: Secondary | ICD-10-CM | POA: Insufficient documentation

## 2015-06-17 DIAGNOSIS — R112 Nausea with vomiting, unspecified: Secondary | ICD-10-CM | POA: Diagnosis not present

## 2015-06-17 DIAGNOSIS — I10 Essential (primary) hypertension: Secondary | ICD-10-CM | POA: Insufficient documentation

## 2015-06-17 DIAGNOSIS — R110 Nausea: Secondary | ICD-10-CM

## 2015-06-17 HISTORY — DX: Diverticulitis of intestine, part unspecified, without perforation or abscess without bleeding: K57.92

## 2015-06-17 LAB — COMPREHENSIVE METABOLIC PANEL
ALBUMIN: 3.4 g/dL — AB (ref 3.5–5.0)
ALT: 10 U/L — ABNORMAL LOW (ref 14–54)
ANION GAP: 7 (ref 5–15)
AST: 20 U/L (ref 15–41)
Alkaline Phosphatase: 32 U/L — ABNORMAL LOW (ref 38–126)
BUN: 6 mg/dL (ref 6–20)
CO2: 26 mmol/L (ref 22–32)
Calcium: 8.4 mg/dL — ABNORMAL LOW (ref 8.9–10.3)
Chloride: 101 mmol/L (ref 101–111)
Creatinine, Ser: 1.12 mg/dL — ABNORMAL HIGH (ref 0.44–1.00)
GFR calc Af Amer: 54 mL/min — ABNORMAL LOW (ref >60)
GFR calc non Af Amer: 46 mL/min — ABNORMAL LOW (ref >60)
GLUCOSE: 117 mg/dL — AB (ref 65–99)
POTASSIUM: 2.9 mmol/L — AB (ref 3.5–5.1)
SODIUM: 134 mmol/L — AB (ref 135–145)
Total Bilirubin: 0.5 mg/dL (ref 0.3–1.2)
Total Protein: 6.3 g/dL — ABNORMAL LOW (ref 6.5–8.1)

## 2015-06-17 LAB — CBC WITH DIFFERENTIAL/PLATELET
BASOS PCT: 1 %
Basophils Absolute: 0 10*3/uL (ref 0–0.1)
EOS ABS: 0.1 10*3/uL (ref 0–0.7)
Eosinophils Relative: 1 %
HCT: 31.8 % — ABNORMAL LOW (ref 35.0–47.0)
HEMOGLOBIN: 10.7 g/dL — AB (ref 12.0–16.0)
Lymphocytes Relative: 15 %
Lymphs Abs: 1.2 10*3/uL (ref 1.0–3.6)
MCH: 29.6 pg (ref 26.0–34.0)
MCHC: 33.7 g/dL (ref 32.0–36.0)
MCV: 87.9 fL (ref 80.0–100.0)
MONO ABS: 0.8 10*3/uL (ref 0.2–0.9)
MONOS PCT: 11 %
NEUTROS PCT: 72 %
Neutro Abs: 5.7 10*3/uL (ref 1.4–6.5)
Platelets: 224 10*3/uL (ref 150–440)
RBC: 3.62 MIL/uL — ABNORMAL LOW (ref 3.80–5.20)
RDW: 13.1 % (ref 11.5–14.5)
WBC: 8 10*3/uL (ref 3.6–11.0)

## 2015-06-17 LAB — URINALYSIS COMPLETE WITH MICROSCOPIC (ARMC ONLY)
BACTERIA UA: NONE SEEN
Bilirubin Urine: NEGATIVE
Glucose, UA: NEGATIVE mg/dL
Hgb urine dipstick: NEGATIVE
KETONES UR: NEGATIVE mg/dL
LEUKOCYTES UA: NEGATIVE
Nitrite: NEGATIVE
PH: 6 (ref 5.0–8.0)
PROTEIN: NEGATIVE mg/dL
RBC / HPF: NONE SEEN RBC/hpf (ref 0–5)
SQUAMOUS EPITHELIAL / LPF: NONE SEEN
Specific Gravity, Urine: 1 — ABNORMAL LOW (ref 1.005–1.030)
WBC UA: NONE SEEN WBC/hpf (ref 0–5)

## 2015-06-17 MED ORDER — ONDANSETRON HCL 4 MG/2ML IJ SOLN
4.0000 mg | Freq: Once | INTRAMUSCULAR | Status: AC
  Administered 2015-06-17: 4 mg via INTRAVENOUS

## 2015-06-17 MED ORDER — SODIUM CHLORIDE 0.9 % IV SOLN
1000.0000 mL | Freq: Once | INTRAVENOUS | Status: AC
  Administered 2015-06-17: 1000 mL via INTRAVENOUS

## 2015-06-17 MED ORDER — ONDANSETRON 4 MG PO TBDP
4.0000 mg | ORAL_TABLET | Freq: Once | ORAL | Status: AC
  Administered 2015-06-17: 4 mg via ORAL
  Filled 2015-06-17: qty 1

## 2015-06-17 MED ORDER — POTASSIUM CHLORIDE CRYS ER 20 MEQ PO TBCR
40.0000 meq | EXTENDED_RELEASE_TABLET | Freq: Once | ORAL | Status: AC
  Administered 2015-06-17: 40 meq via ORAL
  Filled 2015-06-17: qty 2

## 2015-06-17 MED ORDER — ONDANSETRON 4 MG PO TBDP: 4.0000 mg | ORAL_TABLET | Freq: Three times a day (TID) | ORAL | Status: DC | PRN

## 2015-06-17 NOTE — ED Notes (Signed)
Critical called in with a K of 2.9

## 2015-06-17 NOTE — ED Notes (Signed)
Pt presents to ED with vomiting. Was dx with diverticulitis on Wednesday and started oral antibiotics. States she has been vomiting and severely nauseated the past couple of days and has not been able to take her medications as prescribed. Pt daughter states pt may not be able to tolerate oral antibiotics and tends to be sensitive to most medications especially antibiotics. Last year pt had to be admitted for similar illness.

## 2015-06-17 NOTE — Discharge Instructions (Signed)
Dehydration, Adult Dehydration is when you lose more fluids from the body than you take in. Vital organs like the kidneys, brain, and heart cannot function without a proper amount of fluids and salt. Any loss of fluids from the body can cause dehydration.  CAUSES   Vomiting.  Diarrhea.  Excessive sweating.  Excessive urine output.  Fever. SYMPTOMS  Mild dehydration  Thirst.  Dry lips.  Slightly dry mouth. Moderate dehydration  Very dry mouth.  Sunken eyes.  Skin does not bounce back quickly when lightly pinched and released.  Dark urine and decreased urine production.  Decreased tear production.  Headache. Severe dehydration  Very dry mouth.  Extreme thirst.  Rapid, weak pulse (more than 100 beats per minute at rest).  Cold hands and feet.  Not able to sweat in spite of heat and temperature.  Rapid breathing.  Blue lips.  Confusion and lethargy.  Difficulty being awakened.  Minimal urine production.  No tears. DIAGNOSIS  Your caregiver will diagnose dehydration based on your symptoms and your exam. Blood and urine tests will help confirm the diagnosis. The diagnostic evaluation should also identify the cause of dehydration. TREATMENT  Treatment of mild or moderate dehydration can often be done at home by increasing the amount of fluids that you drink. It is best to drink small amounts of fluid more often. Drinking too much at one time can make vomiting worse. Refer to the home care instructions below. Severe dehydration needs to be treated at the hospital where you will probably be given intravenous (IV) fluids that contain water and electrolytes. HOME CARE INSTRUCTIONS   Ask your caregiver about specific rehydration instructions.  Drink enough fluids to keep your urine clear or pale yellow.  Drink small amounts frequently if you have nausea and vomiting.  Eat as you normally do.  Avoid:  Foods or drinks high in sugar.  Carbonated  drinks.  Juice.  Extremely hot or cold fluids.  Drinks with caffeine.  Fatty, greasy foods.  Alcohol.  Tobacco.  Overeating.  Gelatin desserts.  Wash your hands well to avoid spreading bacteria and viruses.  Only take over-the-counter or prescription medicines for pain, discomfort, or fever as directed by your caregiver.  Ask your caregiver if you should continue all prescribed and over-the-counter medicines.  Keep all follow-up appointments with your caregiver. SEEK MEDICAL CARE IF:  You have abdominal pain and it increases or stays in one area (localizes).  You have a rash, stiff neck, or severe headache.  You are irritable, sleepy, or difficult to awaken.  You are weak, dizzy, or extremely thirsty. SEEK IMMEDIATE MEDICAL CARE IF:   You are unable to keep fluids down or you get worse despite treatment.  You have frequent episodes of vomiting or diarrhea.  You have blood or green matter (bile) in your vomit.  You have blood in your stool or your stool looks black and tarry.  You have not urinated in 6 to 8 hours, or you have only urinated a small amount of very dark urine.  You have a fever.  You faint. MAKE SURE YOU:   Understand these instructions.  Will watch your condition.  Will get help right away if you are not doing well or get worse. Document Released: 10/01/2005 Document Revised: 12/24/2011 Document Reviewed: 05/21/2011 Exit[employer] Patient Information 2015 [employer]. This information is not intended to replace advice given to you by your health care provider. Make sure you discuss any questions you have with your health care   provider.  Nausea and Vomiting Nausea is a sick feeling that often comes before throwing up (vomiting). Vomiting is a reflex where stomach contents come out of your mouth. Vomiting can cause severe loss of body fluids (dehydration). Children and elderly adults can become dehydrated quickly, especially if they also have  diarrhea. Nausea and vomiting are symptoms of a condition or disease. It is important to find the cause of your symptoms. CAUSES   Direct irritation of the stomach lining. This irritation can result from increased acid production (gastroesophageal reflux disease), infection, food poisoning, taking certain medicines (such as nonsteroidal anti-inflammatory drugs), alcohol use, or tobacco use.  Signals from the brain.These signals could be caused by a headache, heat exposure, an inner ear disturbance, increased pressure in the brain from injury, infection, a tumor, or a concussion, pain, emotional stimulus, or metabolic problems.  An obstruction in the gastrointestinal tract (bowel obstruction).  Illnesses such as diabetes, hepatitis, gallbladder problems, appendicitis, kidney problems, cancer, sepsis, atypical symptoms of a heart attack, or eating disorders.  Medical treatments such as chemotherapy and radiation.  Receiving medicine that makes you sleep (general anesthetic) during surgery. DIAGNOSIS Your caregiver may ask for tests to be done if the problems do not improve after a few days. Tests may also be done if symptoms are severe or if the reason for the nausea and vomiting is not clear. Tests may include:  Urine tests.  Blood tests.  Stool tests.  Cultures (to look for evidence of infection).  X-rays or other imaging studies. Test results can help your caregiver make decisions about treatment or the need for additional tests. TREATMENT You need to stay well hydrated. Drink frequently but in small amounts.You may wish to drink water, sports drinks, clear broth, or eat frozen ice pops or gelatin dessert to help stay hydrated.When you eat, eating slowly may help prevent nausea.There are also some antinausea medicines that may help prevent nausea. HOME CARE INSTRUCTIONS   Take all medicine as directed by your caregiver.  If you do not have an appetite, do not force yourself to  eat. However, you must continue to drink fluids.  If you have an appetite, eat a normal diet unless your caregiver tells you differently.  Eat a variety of complex carbohydrates (rice, wheat, potatoes, bread), lean meats, yogurt, fruits, and vegetables.  Avoid high-fat foods because they are more difficult to digest.  Drink enough water and fluids to keep your urine clear or pale yellow.  If you are dehydrated, ask your caregiver for specific rehydration instructions. Signs of dehydration may include:  Severe thirst.  Dry lips and mouth.  Dizziness.  Dark urine.  Decreasing urine frequency and amount.  Confusion.  Rapid breathing or pulse. SEEK IMMEDIATE MEDICAL CARE IF:   You have blood or brown flecks (like coffee grounds) in your vomit.  You have black or bloody stools.  You have a severe headache or stiff neck.  You are confused.  You have severe abdominal pain.  You have chest pain or trouble breathing.  You do not urinate at least once every 8 hours.  You develop cold or clammy skin.  You continue to vomit for longer than 24 to 48 hours.  You have a fever. MAKE SURE YOU:   Understand these instructions.  Will watch your condition.  Will get help right away if you are not doing well or get worse. Document Released: 10/01/2005 Document Revised: 12/24/2011 Document Reviewed: 02/28/2011 ExitCare Patient Information 2015 [employer]. This information   is not intended to replace advice given to you by your health care provider. Make sure you discuss any questions you have with your health care provider.  

## 2015-06-17 NOTE — ED Provider Notes (Signed)
[facility] Emergency Department Provider Note     Time seen:----------------------------------------- 9:40 PM on 06/17/2015 -----------------------------------------    I have reviewed the triage vital signs and the nursing notes.   HISTORY  Chief Complaint Vomiting    HPI Michele Montgomery is a 74 y.o. female who presents ER with vomiting. Patient was diagnosed with diverticulitis on Wednesday and started oral antibiotics. Patient states she's been vomiting a very nauseated since she's been taking the medications. She's not been able to take them or keep them down. Daughter states patient has not been able to tolerate oral antibiotics in the past. She patient be admitted for similar illnesses..   Past Medical History  Diagnosis Date  . Hypertension   . Diverticulitis     There are no active problems to display for this patient.   Past Surgical History  Procedure Laterality Date  . Pacemaker placement      Allergies Penicillins and Prednisone  Social History Social History  Substance Use Topics  . Smoking status: Never Smoker   . Smokeless tobacco: None  . Alcohol Use: No    Review of Systems Constitutional: Negative for fever. Eyes: Negative for visual changes. ENT: Negative for sore throat. Cardiovascular: Negative for chest pain. Respiratory: Negative for shortness of breath. Gastrointestinal: Positive for vomiting, recent diarrhea Genitourinary: Negative for dysuria. Musculoskeletal: Negative for back pain. Skin: Negative for rash. Neurological: Negative for headaches, focal weakness or numbness.  10-point ROS otherwise negative.  ____________________________________________   PHYSICAL EXAM:  VITAL SIGNS: ED Triage Vitals  Enc Vitals Group     BP 06/17/15 2107 136/108 mmHg     Pulse Rate 06/17/15 2107 73     Resp 06/17/15 2107 18     Temp 06/17/15 2107 98.4 F (36.9 C)     Temp Source 06/17/15 2107 Oral     SpO2  06/17/15 2107 99 %     Weight 06/17/15 2107 112 lb (50.803 kg)     Height 06/17/15 2107 5' (1.524 m)     Head Cir --      Peak Flow --      Pain Score --      Pain Loc --      Pain Edu? --      Excl. in [place]? --     Constitutional: Alert and oriented. Well appearing and in no distress. Eyes: Conjunctivae are normal. PERRL. Normal extraocular movements. ENT   Head: Normocephalic and atraumatic.   Nose: No congestion/rhinnorhea.   Mouth/Throat: Mucous membranes are moist.   Neck: No stridor. Cardiovascular: Normal rate, regular rhythm. Normal and symmetric distal pulses are present in all extremities. No murmurs, rubs, or gallops. Respiratory: Normal respiratory effort without tachypnea nor retractions. Breath sounds are clear and equal bilaterally. No wheezes/rales/rhonchi. Gastrointestinal: Mild left lower quadrant tenderness, no rebound or guarding. Normal bowel sounds. Musculoskeletal: Nontender with normal range of motion in all extremities. No joint effusions.  No lower extremity tenderness nor edema. Neurologic:  Normal speech and language. No gross focal neurologic deficits are appreciated. Speech is normal. No gait instability. Skin:  Skin is warm, dry and intact. No rash noted. Psychiatric: Mood and affect are normal. Speech and behavior are normal. Patient exhibits appropriate insight and judgment. ___________________________________________  ED COURSE:  Pertinent labs & imaging results that were available during my care of the patient were reviewed by me and considered in my medical decision making (see chart for details). Patient appears little dehydrated, will receive normal saline  bolus, IV Zofran. We'll review recent CT scan. ____________________________________________    LABS (pertinent positives/negatives)  Labs Reviewed  CBC WITH DIFFERENTIAL/PLATELET - Abnormal; Notable for the following:    RBC 3.62 (*)    Hemoglobin 10.7 (*)    HCT 31.8 (*)     All other components within normal limits  COMPREHENSIVE METABOLIC PANEL - Abnormal; Notable for the following:    Sodium 134 (*)    Potassium 2.9 (*)    Glucose, Bld 117 (*)    Creatinine, Ser 1.12 (*)    Calcium 8.4 (*)    Total Protein 6.3 (*)    Albumin 3.4 (*)    ALT 10 (*)    Alkaline Phosphatase 32 (*)    GFR calc non Af Amer 46 (*)    GFR calc Af Amer 54 (*)    All other components within normal limits  URINALYSIS COMPLETEWITH MICROSCOPIC ([facility] ONLY) - Abnormal; Notable for the following:    Color, Urine COLORLESS (*)    APPearance CLEAR (*)    Specific Gravity, Urine 1.000 (*)    All other components within normal limits   ____________________________________________  FINAL ASSESSMENT AND PLAN  Nausea, dehydration  Plan: Patient with labs and imaging as dictated above. Patient with colitis but not diverticulitis on her CT. Currently diarrhea has resolved. She feels like antibiotics are making her sick so will stop these. If she needs to restart an antibiotic, try Flagyl first. At this point she'll be discharged with Zofran, she was given an extra dose of potassium here. She stable for discharge   Earleen Newport, MD   Earleen Newport, MD 06/17/15 [phone]

## 2015-06-19 ENCOUNTER — Emergency Department: Payer: Medicare Other

## 2015-06-19 ENCOUNTER — Emergency Department
Admission: EM | Admit: 2015-06-19 | Discharge: 2015-06-19 | Disposition: A | Discharge status: Home / Self Care | Payer: Medicare Other | Attending: Emergency Medicine | Admitting: Emergency Medicine

## 2015-06-19 ENCOUNTER — Encounter: Payer: Self-pay | Admitting: Medical Oncology

## 2015-06-19 DIAGNOSIS — R197 Diarrhea, unspecified: Secondary | ICD-10-CM | POA: Diagnosis not present

## 2015-06-19 DIAGNOSIS — R0789 Other chest pain: Secondary | ICD-10-CM | POA: Insufficient documentation

## 2015-06-19 DIAGNOSIS — I10 Essential (primary) hypertension: Secondary | ICD-10-CM | POA: Insufficient documentation

## 2015-06-19 DIAGNOSIS — Z880 Allergy status to penicillin: Secondary | ICD-10-CM | POA: Diagnosis not present

## 2015-06-19 DIAGNOSIS — R112 Nausea with vomiting, unspecified: Secondary | ICD-10-CM | POA: Diagnosis not present

## 2015-06-19 DIAGNOSIS — Z79899 Other long term (current) drug therapy: Secondary | ICD-10-CM | POA: Insufficient documentation

## 2015-06-19 DIAGNOSIS — Z7982 Long term (current) use of aspirin: Secondary | ICD-10-CM | POA: Diagnosis not present

## 2015-06-19 DIAGNOSIS — R079 Chest pain, unspecified: Secondary | ICD-10-CM | POA: Diagnosis present

## 2015-06-19 LAB — BASIC METABOLIC PANEL
ANION GAP: 7 (ref 5–15)
BUN: 6 mg/dL (ref 6–20)
CHLORIDE: 102 mmol/L (ref 101–111)
CO2: 26 mmol/L (ref 22–32)
Calcium: 8.9 mg/dL (ref 8.9–10.3)
Creatinine, Ser: 1.17 mg/dL — ABNORMAL HIGH (ref 0.44–1.00)
GFR calc Af Amer: 51 mL/min — ABNORMAL LOW (ref >60)
GFR, EST NON AFRICAN AMERICAN: 44 mL/min — AB (ref >60)
GLUCOSE: 149 mg/dL — AB (ref 65–99)
POTASSIUM: 3.7 mmol/L (ref 3.5–5.1)
Sodium: 135 mmol/L (ref 135–145)

## 2015-06-19 LAB — CBC
HEMATOCRIT: 33.4 % — AB (ref 35.0–47.0)
HEMOGLOBIN: 11.3 g/dL — AB (ref 12.0–16.0)
MCH: 29.7 pg (ref 26.0–34.0)
MCHC: 33.8 g/dL (ref 32.0–36.0)
MCV: 87.8 fL (ref 80.0–100.0)
Platelets: 285 10*3/uL (ref 150–440)
RBC: 3.8 MIL/uL (ref 3.80–5.20)
RDW: 13.4 % (ref 11.5–14.5)
WBC: 6.6 10*3/uL (ref 3.6–11.0)

## 2015-06-19 LAB — TROPONIN I: Troponin I: <0.03 ng/mL (ref <0.031)

## 2015-06-19 NOTE — ED Notes (Signed)
MD at bedside. 

## 2015-06-19 NOTE — ED Notes (Signed)
Pt reports pain to left side of chest over her pace maker that began Friday while here in the ER for vomiting, since then the pain has worsened and pt feels like her throat is tight and she is sob. Pace maker was placed in 2011. Pt also continues to have vomiting.

## 2015-06-19 NOTE — ED Provider Notes (Signed)
[facility] Emergency Department Provider Note  ____________________________________________  Time seen: Approximately 4:11 PM  I have reviewed the triage vital signs and the nursing notes.   HISTORY  Chief Complaint Chest Pain    HPI Michele Montgomery is a 74 y.o. female with multiple chronic medical problems but in general is relatively good health for her age and who also recently and suddenly lost her husband about one year ago and lost her mother within the last 2 months presentswith several symptoms including tightness around her pacemaker several days ago, and improved vomiting and diarrhea since her last ED visit 2 days ago.  She did have one episode of vomiting today after taking a potassium supplement.  She currently is asymptomatic.she describes the chest tightness as mild and lasting a few minutes and is now completely resolved.  She is also been treated recently by her PCP, Dr. Georgie Chard, with antibiotics for colitis versus diverticulitis.   Past Medical History  Diagnosis Date  . Hypertension   . Diverticulitis     There are no active problems to display for this patient.   Past Surgical History  Procedure Laterality Date  . Pacemaker placement      Current Outpatient Rx  Name  Route  Sig  Dispense  Refill  . aspirin 81 MG tablet   Oral   Take 1 tablet by mouth daily.         . carvedilol (COREG) 25 MG tablet   Oral   Take 1 tablet by mouth 2 (two) times daily.      1   . loperamide (IMODIUM A-D) 2 MG tablet   Oral   Take 2 mg by mouth 2 (two) times daily as needed for diarrhea or loose stools.         Marland Kitchen LORazepam (ATIVAN) 0.5 MG tablet   Oral   Take 1 mg by mouth at bedtime.       0   . ondansetron (ZOFRAN ODT) 4 MG disintegrating tablet   Oral   Take 1 tablet (4 mg total) by mouth every 8 (eight) hours as needed for nausea or vomiting.   20 tablet   0   . pantoprazole (PROTONIX) 40 MG tablet   Oral   Take 1  tablet by mouth daily.      2   . ramipril (ALTACE) 10 MG capsule   Oral   Take 1 capsule by mouth daily.      1   . simvastatin (ZOCOR) 40 MG tablet   Oral   Take 1 tablet by mouth daily.      2     Allergies Penicillins; Ciprofloxacin; Metronidazole; and Prednisone  No family history on file.  Social History Social History  Substance Use Topics  . Smoking status: Never Smoker   . Smokeless tobacco: None  . Alcohol Use: No    Review of Systems Constitutional: No fever/chills Eyes: No visual changes. ENT: No sore throat.occasional feeling of dysphagia, currently not present Cardiovascular: mild chest tightness around the pacemaker Respiratory: Denies shortness of breath. Gastrointestinal: No abdominal pain.  Multiple episodes of recent vomiting and diarrhea but now improved.  No constipation. Genitourinary: Negative for dysuria. Musculoskeletal: Negative for back pain. Skin: Negative for rash. Neurological: Negative for headaches, focal weakness or numbness.  10-point ROS otherwise negative.  ____________________________________________   PHYSICAL EXAM:  VITAL SIGNS: ED Triage Vitals  Enc Vitals Group     BP 06/19/15 1412 167/70 mmHg  Pulse Rate 06/19/15 1412 66     Resp 06/19/15 1412 18     Temp 06/19/15 1412 97.8 F (36.6 C)     Temp Source 06/19/15 1412 Oral     SpO2 06/19/15 1412 98 %     Weight 06/19/15 1410 112 lb (50.803 kg)     Height 06/19/15 1410 5' (1.524 m)     Head Cir --      Peak Flow --      Pain Score 06/19/15 1410 5     Pain Loc --      Pain Edu? --      Excl. in [place]? --     Constitutional: Alert and oriented. Well appearing and in no acute distress. Eyes: Conjunctivae are normal. PERRL. EOMI. Head: Atraumatic. Nose: No congestion/rhinnorhea. Mouth/Throat: Mucous membranes are moist.  Oropharynx non-erythematous. Neck: No stridor.   Cardiovascular: Normal rate, regular rhythm. Grossly normal heart sounds.  Good peripheral  circulation.well-appearing left chest pacemaker. Respiratory: Normal respiratory effort.  No retractions. Lungs CTAB. Gastrointestinal: Soft and nontender. No distention. No abdominal bruits. No CVA tenderness. Musculoskeletal: No lower extremity tenderness nor edema.  No joint effusions. Neurologic:  Normal speech and language. No gross focal neurologic deficits are appreciated.  Skin:  Skin is warm, dry and intact. No rash noted. Psychiatric: Mood and affect are normal. Speech and behavior are normal. When I brought up her recent emotional challenges, the patient immediately became tearful and did admit to significant depression over the loss of her husband and the other health issues in her family.  ____________________________________________   LABS (all labs ordered are listed, but only abnormal results are displayed)  Labs Reviewed  BASIC METABOLIC PANEL - Abnormal; Notable for the following:    Glucose, Bld 149 (*)    Creatinine, Ser 1.17 (*)    GFR calc non Af Amer 44 (*)    GFR calc Af Amer 51 (*)    All other components within normal limits  CBC - Abnormal; Notable for the following:    Hemoglobin 11.3 (*)    HCT 33.4 (*)    All other components within normal limits  TROPONIN I   ____________________________________________  EKG  ED ECG REPORT I, Colleen Donahoe, the attending physician, personally viewed and interpreted this ECG.   Date: 06/19/2015  EKG Time: 14:13  Rate: 71  Rhythm: ventricular pacemaker  Axis: LAD  Intervals:paced rhythm  ST&T Change: Non-specific ST segment / T-wave changes, but no evidence of acute ischemia.  ____________________________________________  RADIOLOGY   Dg Chest 2 View  06/19/2015   CLINICAL DATA:  Chest pain starting around launch time today. Pressure and tightness.  EXAM: CHEST  2 VIEW  COMPARISON:  06/18/2013  FINDINGS: Implantable defibrillator noted, lead positioning unchanged.  Dextroconvex thoracic scoliosis. Mild chronic  interstitial accentuation in the lungs. Mild scarring along the left hemidiaphragm.  Thoracic spondylosis. The lungs appear otherwise clear. No pleural effusion. No edema.  Biapical pleural parenchymal scarring.  IMPRESSION: 1. No acute thoracic findings. Mild chronic interstitial accentuation. Defibrillator device in place. 2. Biapical pleural parenchymal scarring. 3. Mild scarring along the left hemidiaphragm. 4. Thoracic spondylosis.   Electronically Signed   By: Van Clines M.D.   On: 06/19/2015 14:53    ____________________________________________   PROCEDURES  Procedure(s) performed: None  Critical Care performed: No ____________________________________________   INITIAL IMPRESSION / ASSESSMENT AND PLAN / ED COURSE  Pertinent labs & imaging results that were available during my care of the patient were  reviewed by me and considered in my medical decision making (see chart for details).  The patient's laboratory evaluation is reassuring as is her chest x-ray.  I spoke in person with Dr. Doy Hutching about this patient.  He believes, and I agree, that her recent emotional challenges are contributing to her overall feeling of well being.  I discussed this with the patient and her family who are present at bedside.  I gave her reassurance that her workup is normal today as are her vital signs.  I encouraged her to continue taking her antibiotics if she can tolerate them but she expressed great concern over the nausea and vomiting that they cause.  I told her she can stop taking her potassium supplements, which she also feels cause her to be nauseated, since her potassium is up to 3.7 today.  I encouraged her to follow up with Dr. Doy Hutching in clinic next week.  She and her family understand and agree.  ____________________________________________  FINAL CLINICAL IMPRESSION(S) / ED DIAGNOSES  Final diagnoses:  Atypical chest pain  Non-intractable vomiting with nausea, vomiting of  unspecified type      NEW MEDICATIONS STARTED DURING THIS VISIT:  Discharge Medication List as of 06/19/2015  4:52 PM       Hinda Kehr, MD 06/19/15 2309

## 2015-06-19 NOTE — Discharge Instructions (Signed)
As we discussed, your workup today is reassuring.  We think that all of the issues you have been dealing with recently are likely causing you to feel badly, but physically you seem to be improving since your recent emergency department visit.  We believe you can stop taking the potassium at this time because your potassium level was normal today.  We encourage you to continue taking the prescribed antibiotics if you can tolerate them.  Please follow up with Dr. Doy Hutching next week as scheduled or call his office on Tuesday to see if they can see you earlier in the week.  The emergency department if he develop new or worsening symptoms that concern you.   Chest Pain (Nonspecific) It is often hard to give a specific diagnosis for the cause of chest pain. There is always a chance that your pain could be related to something serious, such as a heart attack or a blood clot in the lungs. You need to follow up with your health care provider for further evaluation. CAUSES   Heartburn.  Pneumonia or bronchitis.  Anxiety or stress.  Inflammation around your heart (pericarditis) or lung (pleuritis or pleurisy).  A blood clot in the lung.  A collapsed lung (pneumothorax). It can develop suddenly on its own (spontaneous pneumothorax) or from trauma to the chest.  Shingles infection (herpes zoster virus). The chest wall is composed of bones, muscles, and cartilage. Any of these can be the source of the pain.  The bones can be bruised by injury.  The muscles or cartilage can be strained by coughing or overwork.  The cartilage can be affected by inflammation and become sore (costochondritis). DIAGNOSIS  Lab tests or other studies may be needed to find the cause of your pain. Your health care provider may have you take a test called an ambulatory electrocardiogram (ECG). An ECG records your heartbeat patterns over a 24-hour period. You may also have other tests, such as:  Transthoracic echocardiogram  (TTE). During echocardiography, sound waves are used to evaluate how blood flows through your heart.  Transesophageal echocardiogram (TEE).  Cardiac monitoring. This allows your health care provider to monitor your heart rate and rhythm in real time.  Holter monitor. This is a portable device that records your heartbeat and can help diagnose heart arrhythmias. It allows your health care provider to track your heart activity for several days, if needed.  Stress tests by exercise or by giving medicine that makes the heart beat faster. TREATMENT   Treatment depends on what may be causing your chest pain. Treatment may include:  Acid blockers for heartburn.  Anti-inflammatory medicine.  Pain medicine for inflammatory conditions.  Antibiotics if an infection is present.  You may be advised to change lifestyle habits. This includes stopping smoking and avoiding alcohol, caffeine, and chocolate.  You may be advised to keep your head raised (elevated) when sleeping. This reduces the chance of acid going backward from your stomach into your esophagus. Most of the time, nonspecific chest pain will improve within 2-3 days with rest and mild pain medicine.  HOME CARE INSTRUCTIONS   If antibiotics were prescribed, take them as directed. Finish them even if you start to feel better.  For the next few days, avoid physical activities that bring on chest pain. Continue physical activities as directed.  Do not use any tobacco products, including cigarettes, chewing tobacco, or electronic cigarettes.  Avoid drinking alcohol.  Only take medicine as directed by your health care provider.  Follow  your health care provider's suggestions for further testing if your chest pain does not go away.  Keep any follow-up appointments you made. If you do not go to an appointment, you could develop lasting (chronic) problems with pain. If there is any problem keeping an appointment, call to reschedule. SEEK  MEDICAL CARE IF:   Your chest pain does not go away, even after treatment.  You have a rash with blisters on your chest.  You have a fever. SEEK IMMEDIATE MEDICAL CARE IF:   You have increased chest pain or pain that spreads to your arm, neck, jaw, back, or abdomen.  You have shortness of breath.  You have an increasing cough, or you cough up blood.  You have severe back or abdominal pain.  You feel nauseous or vomit.  You have severe weakness.  You faint.  You have chills. This is an emergency. Do not wait to see if the pain will go away. Get medical help at once. Call your local emergency services (911 in [place] Do not drive yourself to the hospital. MAKE SURE YOU:   Understand these instructions.  Will watch your condition.  Will get help right away if you are not doing well or get worse. Document Released: 07/11/2005 Document Revised: 10/06/2013 Document Reviewed: 05/06/2008 [facility] Patient Information 2015 [place], [place]. This information is not intended to replace advice given to you by your health care provider. Make sure you discuss any questions you have with your health care provider.  Nausea and Vomiting Nausea is a sick feeling that often comes before throwing up (vomiting). Vomiting is a reflex where stomach contents come out of your mouth. Vomiting can cause severe loss of body fluids (dehydration). Children and elderly adults can become dehydrated quickly, especially if they also have diarrhea. Nausea and vomiting are symptoms of a condition or disease. It is important to find the cause of your symptoms. CAUSES   Direct irritation of the stomach lining. This irritation can result from increased acid production (gastroesophageal reflux disease), infection, food poisoning, taking certain medicines (such as nonsteroidal anti-inflammatory drugs), alcohol use, or tobacco use.  Signals from the brain.These signals could be caused by a headache, heat exposure, an  inner ear disturbance, increased pressure in the brain from injury, infection, a tumor, or a concussion, pain, emotional stimulus, or metabolic problems.  An obstruction in the gastrointestinal tract (bowel obstruction).  Illnesses such as diabetes, hepatitis, gallbladder problems, appendicitis, kidney problems, cancer, sepsis, atypical symptoms of a heart attack, or eating disorders.  Medical treatments such as chemotherapy and radiation.  Receiving medicine that makes you sleep (general anesthetic) during surgery. DIAGNOSIS Your caregiver may ask for tests to be done if the problems do not improve after a few days. Tests may also be done if symptoms are severe or if the reason for the nausea and vomiting is not clear. Tests may include:  Urine tests.  Blood tests.  Stool tests.  Cultures (to look for evidence of infection).  X-rays or other imaging studies. Test results can help your caregiver make decisions about treatment or the need for additional tests. TREATMENT You need to stay well hydrated. Drink frequently but in small amounts.You may wish to drink water, sports drinks, clear broth, or eat frozen ice pops or gelatin dessert to help stay hydrated.When you eat, eating slowly may help prevent nausea.There are also some antinausea medicines that may help prevent nausea. HOME CARE INSTRUCTIONS   Take all medicine as directed by your caregiver.  If you do not have an appetite, do not force yourself to eat. However, you must continue to drink fluids.  If you have an appetite, eat a normal diet unless your caregiver tells you differently.  Eat a variety of complex carbohydrates (rice, wheat, potatoes, bread), lean meats, yogurt, fruits, and vegetables.  Avoid high-fat foods because they are more difficult to digest.  Drink enough water and fluids to keep your urine clear or pale yellow.  If you are dehydrated, ask your caregiver for specific rehydration instructions. Signs  of dehydration may include:  Severe thirst.  Dry lips and mouth.  Dizziness.  Dark urine.  Decreasing urine frequency and amount.  Confusion.  Rapid breathing or pulse. SEEK IMMEDIATE MEDICAL CARE IF:   You have blood or brown flecks (like coffee grounds) in your vomit.  You have black or bloody stools.  You have a severe headache or stiff neck.  You are confused.  You have severe abdominal pain.  You have chest pain or trouble breathing.  You do not urinate at least once every 8 hours.  You develop cold or clammy skin.  You continue to vomit for longer than 24 to 48 hours.  You have a fever. MAKE SURE YOU:   Understand these instructions.  Will watch your condition.  Will get help right away if you are not doing well or get worse. Document Released: 10/01/2005 Document Revised: 12/24/2011 Document Reviewed: 02/28/2011 [facility] Patient Information 2015 [place], [place]. This information is not intended to replace advice given to you by your health care provider. Make sure you discuss any questions you have with your health care provider.

## 2015-07-29 ENCOUNTER — Encounter: Payer: Self-pay | Admitting: *Deleted

## 2015-08-01 ENCOUNTER — Ambulatory Visit: Payer: Medicare Other | Admitting: Anesthesiology

## 2015-08-01 ENCOUNTER — Encounter
Admission: RE | Disposition: A | Discharge status: Home / Self Care | Payer: Self-pay | Source: Ambulatory Visit | Attending: Gastroenterology

## 2015-08-01 ENCOUNTER — Encounter: Payer: Self-pay | Admitting: *Deleted

## 2015-08-01 ENCOUNTER — Ambulatory Visit
Admission: RE | Admit: 2015-08-01 | Discharge: 2015-08-01 | Disposition: A | Discharge status: Home / Self Care | Payer: Medicare Other | Source: Ambulatory Visit | Attending: Gastroenterology | Admitting: Gastroenterology

## 2015-08-01 DIAGNOSIS — M1990 Unspecified osteoarthritis, unspecified site: Secondary | ICD-10-CM | POA: Insufficient documentation

## 2015-08-01 DIAGNOSIS — I878 Other specified disorders of veins: Secondary | ICD-10-CM | POA: Diagnosis not present

## 2015-08-01 DIAGNOSIS — I10 Essential (primary) hypertension: Secondary | ICD-10-CM | POA: Diagnosis not present

## 2015-08-01 DIAGNOSIS — K640 First degree hemorrhoids: Secondary | ICD-10-CM | POA: Insufficient documentation

## 2015-08-01 DIAGNOSIS — Z880 Allergy status to penicillin: Secondary | ICD-10-CM | POA: Insufficient documentation

## 2015-08-01 DIAGNOSIS — D649 Anemia, unspecified: Secondary | ICD-10-CM | POA: Diagnosis present

## 2015-08-01 DIAGNOSIS — M810 Age-related osteoporosis without current pathological fracture: Secondary | ICD-10-CM | POA: Diagnosis not present

## 2015-08-01 DIAGNOSIS — Z7982 Long term (current) use of aspirin: Secondary | ICD-10-CM | POA: Insufficient documentation

## 2015-08-01 DIAGNOSIS — Z881 Allergy status to other antibiotic agents status: Secondary | ICD-10-CM | POA: Diagnosis not present

## 2015-08-01 DIAGNOSIS — Z888 Allergy status to other drugs, medicaments and biological substances status: Secondary | ICD-10-CM | POA: Insufficient documentation

## 2015-08-01 DIAGNOSIS — Z950 Presence of cardiac pacemaker: Secondary | ICD-10-CM | POA: Insufficient documentation

## 2015-08-01 DIAGNOSIS — K5730 Diverticulosis of large intestine without perforation or abscess without bleeding: Secondary | ICD-10-CM | POA: Diagnosis not present

## 2015-08-01 DIAGNOSIS — E785 Hyperlipidemia, unspecified: Secondary | ICD-10-CM | POA: Insufficient documentation

## 2015-08-01 DIAGNOSIS — R634 Abnormal weight loss: Secondary | ICD-10-CM | POA: Insufficient documentation

## 2015-08-01 DIAGNOSIS — I420 Dilated cardiomyopathy: Secondary | ICD-10-CM | POA: Insufficient documentation

## 2015-08-01 DIAGNOSIS — K317 Polyp of stomach and duodenum: Secondary | ICD-10-CM | POA: Insufficient documentation

## 2015-08-01 DIAGNOSIS — K219 Gastro-esophageal reflux disease without esophagitis: Secondary | ICD-10-CM | POA: Diagnosis not present

## 2015-08-01 HISTORY — DX: Dilated cardiomyopathy: I42.0

## 2015-08-01 HISTORY — DX: Cardiac arrhythmia, unspecified: I49.9

## 2015-08-01 HISTORY — PX: ESOPHAGOGASTRODUODENOSCOPY (EGD) WITH PROPOFOL: SHX5813

## 2015-08-01 HISTORY — DX: Cardiomegaly: I51.7

## 2015-08-01 HISTORY — DX: Iodine-deficiency related diffuse (endemic) goiter: E01.0

## 2015-08-01 HISTORY — DX: Age-related osteoporosis without current pathological fracture: M81.0

## 2015-08-01 HISTORY — DX: Anemia, unspecified: D64.9

## 2015-08-01 HISTORY — PX: COLONOSCOPY WITH PROPOFOL: SHX5780

## 2015-08-01 HISTORY — DX: Gastro-esophageal reflux disease without esophagitis: K21.9

## 2015-08-01 HISTORY — DX: Unspecified ovarian cyst, unspecified side: N83.209

## 2015-08-01 HISTORY — DX: Hyperlipidemia, unspecified: E78.5

## 2015-08-01 HISTORY — DX: Other specified disorders of veins: I87.8

## 2015-08-01 HISTORY — DX: Unspecified osteoarthritis, unspecified site: M19.90

## 2015-08-01 HISTORY — DX: Noninfective gastroenteritis and colitis, unspecified: K52.9

## 2015-08-01 SURGERY — COLONOSCOPY WITH PROPOFOL
Anesthesia: General

## 2015-08-01 MED ORDER — PROPOFOL 10 MG/ML IV BOLUS
INTRAVENOUS | Status: DC | PRN
Start: 2015-08-01 — End: 2015-08-01
  Administered 2015-08-01: 50 mg via INTRAVENOUS
  Administered 2015-08-01: 20 mg via INTRAVENOUS

## 2015-08-01 MED ORDER — LIDOCAINE HCL (CARDIAC) 20 MG/ML IV SOLN
INTRAVENOUS | Status: DC | PRN
  Administered 2015-08-01: 100 mg via INTRAVENOUS

## 2015-08-01 MED ORDER — SODIUM CHLORIDE 0.9 % IV SOLN
INTRAVENOUS | Status: DC
  Administered 2015-08-01: 10:00:00 via INTRAVENOUS

## 2015-08-01 MED ORDER — EPHEDRINE SULFATE 50 MG/ML IJ SOLN
INTRAMUSCULAR | Status: DC | PRN
  Administered 2015-08-01: 10 mg via INTRAVENOUS

## 2015-08-01 MED ORDER — PROPOFOL 500 MG/50ML IV EMUL
INTRAVENOUS | Status: DC | PRN
  Administered 2015-08-01: 160 ug/kg/min via INTRAVENOUS

## 2015-08-01 NOTE — Anesthesia Preprocedure Evaluation (Signed)
Anesthesia Evaluation  Patient identified by MRN, date of birth, ID band Patient awake    Reviewed: Allergy & Precautions, NPO status , Patient's Chart, lab work & pertinent test results  History of Anesthesia Complications Negative for: history of anesthetic complications  Airway Mallampati: II       Dental  (+) Teeth Intact   Pulmonary neg pulmonary ROS,           Cardiovascular hypertension, Pt. on medications and Pt. on home beta blockers + pacemaker + Cardiac Defibrillator      Neuro/Psych negative neurological ROS     GI/Hepatic Neg liver ROS, GERD  Medicated,  Endo/Other  negative endocrine ROS  Renal/GU negative Renal ROS     Musculoskeletal  (+) Arthritis , Osteoarthritis,    Abdominal   Peds  Hematology  (+) anemia ,   Anesthesia Other Findings   Reproductive/Obstetrics                             Anesthesia Physical Anesthesia Plan  ASA: III  Anesthesia Plan: General   Post-op Pain Management:    Induction: Intravenous  Airway Management Planned: Nasal Cannula  Additional Equipment:   Intra-op Plan:   Post-operative Plan:   Informed Consent: I have reviewed the patients History and Physical, chart, labs and discussed the procedure including the risks, benefits and alternatives for the proposed anesthesia with the patient or authorized representative who has indicated his/her understanding and acceptance.     Plan Discussed with:   Anesthesia Plan Comments:         Anesthesia Quick Evaluation

## 2015-08-01 NOTE — H&P (Signed)
Primary Care Physician:  Idelle Crouch, MD  Pre-Procedure History & Physical: HPI:  Michele Montgomery is a 74 y.o. female is here for an endoscopy / colonoscopy  Past Medical History  Diagnosis Date  . Hypertension   . Diverticulitis   . Anemia   . Colitis   . Enlarged heart   . Dilated cardiomyopathy ([place])   . Presence of permanent cardiac pacemaker   . GERD (gastroesophageal reflux disease)   . Ovarian cyst   . Hyperlipidemia   . Arthritis     osteo  . Osteoporosis   . Thyromegaly   . Venous stasis   . Dysrhythmia     Past Surgical History  Procedure Laterality Date  . Pacemaker placement      Prior to Admission medications   Medication Sig Start Date End Date Taking? Authorizing Provider  aspirin 81 MG tablet Take 1 tablet by mouth daily. 11/25/09  Yes Historical Provider, MD  carvedilol (COREG) 25 MG tablet Take 1 tablet by mouth 2 (two) times daily. 04/02/15  Yes Historical Provider, MD  cetirizine (ZYRTEC) 10 MG tablet Take 10 mg by mouth daily.   Yes Historical Provider, MD  cholecalciferol (VITAMIN D) 400 UNITS TABS tablet Take 400 Units by mouth.   Yes Historical Provider, MD  LORazepam (ATIVAN) 0.5 MG tablet Take 1 mg by mouth at bedtime.  06/09/15  Yes Historical Provider, MD  ramipril (ALTACE) 10 MG capsule Take 1 capsule by mouth daily. 03/29/15  Yes Historical Provider, MD  loperamide (IMODIUM A-D) 2 MG tablet Take 2 mg by mouth 2 (two) times daily as needed for diarrhea or loose stools.    Historical Provider, MD  ondansetron (ZOFRAN ODT) 4 MG disintegrating tablet Take 1 tablet (4 mg total) by mouth every 8 (eight) hours as needed for nausea or vomiting. 06/17/15   Earleen Newport, MD  pantoprazole (PROTONIX) 40 MG tablet Take 1 tablet by mouth daily. 03/29/15   Historical Provider, MD  simvastatin (ZOCOR) 40 MG tablet Take 1 tablet by mouth daily. 04/03/15   Historical Provider, MD    Allergies as of 07/27/2015 - Review Complete 06/19/2015  Allergen  Reaction Noted  . Penicillins Anaphylaxis 06/15/2015  . Ciprofloxacin Nausea And Vomiting 06/19/2015  . Metronidazole Nausea And Vomiting 06/19/2015  . Prednisone Hives 06/15/2015    History reviewed. No pertinent family history.  Social History   Social History  . Marital Status: Widowed    Spouse Name: N/A  . Number of Children: N/A  . Years of Education: N/A   Occupational History  . Not on file.   Social History Main Topics  . Smoking status: Never Smoker   . Smokeless tobacco: Never Used  . Alcohol Use: No  . Drug Use: No  . Sexual Activity: Not on file   Other Topics Concern  . Not on file   Social History Narrative     Physical Exam: BP 144/64 mmHg  Pulse 64  Temp(Src) 97.4 F (36.3 C) (Tympanic)  Resp 20  Ht 5' (1.524 m)  Wt 48.535 kg (107 lb)  BMI 20.90 kg/m2  SpO2 100% General:   Alert,  pleasant and cooperative in NAD Head:  Normocephalic and atraumatic. Neck:  Supple; no masses or thyromegaly. Lungs:  Clear throughout to auscultation.    Heart:  Regular rate and rhythm. Abdomen:  Soft, nontender and nondistended. Normal bowel sounds, without guarding, and without rebound.   Neurologic:  Alert and  oriented x4;  grossly normal neurologically.  Impression/Plan: Michele Montgomery is here for an endoscopy to be performed for GERD,melena, weight loss, colonoscopy for diarrhea, melena, wt loss  Risks, benefits, limitations, and alternatives regarding  Endoscopy/ colonoscppy have been reviewed with the patient.  Questions have been answered.  All parties agreeable.   Josefine Class, MD  08/01/2015, 10:32 AM

## 2015-08-01 NOTE — Discharge Instructions (Signed)

## 2015-08-01 NOTE — Anesthesia Postprocedure Evaluation (Signed)
  Anesthesia Post-op Note  Patient: Michele Montgomery  Procedure(s) Performed: Procedure(s): COLONOSCOPY WITH PROPOFOL (N/A) ESOPHAGOGASTRODUODENOSCOPY (EGD) WITH PROPOFOL (N/A)  Anesthesia type:General  Patient location: PACU  Post pain: Pain level controlled  Post assessment: Post-op Vital signs reviewed, Patient's Cardiovascular Status Stable, Respiratory Function Stable, Patent Airway and No signs of Nausea or vomiting  Post vital signs: Reviewed and stable  Last Vitals:  Filed Vitals:   08/01/15 1140  BP: 139/66  Pulse:   Temp:   Resp:     Level of consciousness: awake, alert  and patient cooperative  Complications: No apparent anesthesia complications

## 2015-08-01 NOTE — Op Note (Signed)
[facility] Gastroenterology Patient Name: Michele Montgomery Procedure Date: 08/01/2015 10:36 AM MRN: [id number] Account #: [id number] Date of Birth: [birth year] Admit Type: Outpatient Age: 74 Room: [facility] ENDO ROOM 1 Gender: Female Note Status: Finalized Procedure:         Colonoscopy Indications:       Chronic diarrhea, Melena, Weight loss Patient Profile:   This is a 74 year old female. Providers:         Gerrit Heck. Rayann Heman, MD Referring MD:      Leonie Douglas. Doy Hutching, MD (Referring MD) Medicines:         Propofol per Anesthesia Complications:     No immediate complications. Procedure:         Pre-Anesthesia Assessment:                    - Prior to the procedure, a History and Physical was                     performed, and patient medications, allergies and                     sensitivities were reviewed. The patient's tolerance of                     previous anesthesia was reviewed.                    After obtaining informed consent, the colonoscope was                     passed under direct vision. Throughout the procedure, the                     patient's blood pressure, pulse, and oxygen saturations                     were monitored continuously. The Colonoscope was                     introduced through the anus and advanced to the the                     terminal ileum. The colonoscopy was performed without                     difficulty. The patient tolerated the procedure well. The                     quality of the bowel preparation was good. Findings:      The perianal and digital rectal examinations were normal.      Many small and large-mouthed diverticula were found in the sigmoid colon.      The terminal ileum appeared normal.      Internal hemorrhoids were found during retroflexion. The hemorrhoids       were Grade I (internal hemorrhoids that do not prolapse).      The exam was otherwise without abnormality.      Biopsies for histology were taken  with a cold forceps from the right       colon, left colon and rectum for evaluation of microscopic colitis. Impression:        - Diverticulosis in the sigmoid colon.                    - The  examined portion of the ileum was normal.                    - Internal hemorrhoids.                    - The examination was otherwise normal.                    - Biopsies were taken with a cold forceps from the right                     colon, left colon and rectum for evaluation of microscopic                     colitis. Recommendation:    - Observe patient in GI recovery unit.                    - Resume regular diet.                    - Continue present medications.                    - Await pathology results.                    - Return to GI clinic.                    - Check iron, ferritin, %sat.                    - Perform capsule endoscopy for further melena or if iron                     studies are c/w IDA                    - The findings and recommendations were discussed with the                     patient.                    - The findings and recommendations were discussed with the                     patient's family. Procedure Code(s): --- Professional ---                    [phone], Colonoscopy, flexible; with biopsy, single or                     multiple CPT copyright 2014 [employer]. All rights reserved. The codes documented in this report are preliminary and upon coder review may  be revised to meet current compliance requirements. Mellody Life, MD 08/01/2015 11:12:25 AM This report has been signed electronically. Number of Addenda: 0 Note Initiated On: 08/01/2015 10:36 AM Scope Withdrawal Time: 0 hours 8 minutes 59 seconds  Total Procedure Duration: 0 hours 13 minutes 39 seconds       [facility]

## 2015-08-01 NOTE — Transfer of Care (Signed)
Immediate Anesthesia Transfer of Care Note  Patient: Michele Montgomery  Procedure(s) Performed: Procedure(s): COLONOSCOPY WITH PROPOFOL (N/A) ESOPHAGOGASTRODUODENOSCOPY (EGD) WITH PROPOFOL (N/A)  Patient Location: PACU  Anesthesia Type:General  Level of Consciousness: awake, alert  and oriented  Airway & Oxygen Therapy: Patient Spontanous Breathing and Patient connected to nasal cannula oxygen  Post-op Assessment: Report given to RN and Post -op Vital signs reviewed and stable  Post vital signs: stable  Last Vitals:  Filed Vitals:   08/01/15 1112  BP: 116/60  Pulse: 60  Temp: 36 C  Resp: 21    Complications: No apparent anesthesia complications

## 2015-08-01 NOTE — Op Note (Signed)
[facility] Gastroenterology Patient Name: Michele Montgomery Procedure Date: 08/01/2015 10:34 AM MRN: [id number] Account #: [id number] Date of Birth: [birth year] Admit Type: Outpatient Age: 74 Room: [facility] ENDO ROOM 1 Gender: Female Note Status: Finalized Procedure:         Upper GI endoscopy Indications:       Anemia, Heartburn, Suspected esophageal reflux, Melena,                     Weight loss Patient Profile:   This is a 74 year old female. Providers:         Gerrit Heck. Rayann Heman, MD Referring MD:      Leonie Douglas. Doy Hutching, MD (Referring MD) Medicines:         Propofol per Anesthesia Complications:     No immediate complications. Procedure:         Pre-Anesthesia Assessment:                    - Prior to the procedure, a History and Physical was                     performed, and patient medications, allergies and                     sensitivities were reviewed. The patient's tolerance of                     previous anesthesia was reviewed.                    After obtaining informed consent, the endoscope was passed                     under direct vision. Throughout the procedure, the                     patient's blood pressure, pulse, and oxygen saturations                     were monitored continuously. The Endoscope was introduced                     through the mouth, and advanced to the second part of                     duodenum. The upper GI endoscopy was accomplished without                     difficulty. The patient tolerated the procedure well. Findings:      The esophagus was normal.      Multiple 1 to 4 mm sessile polyps with no bleeding were found in the       gastric body. Biopsies were taken with a cold forceps for histology.       Stomch otherwise normal.      The examined duodenum was normal. Impression:        - Normal esophagus.                    - Multiple gastric polyps. Biopsied.                    - Normal examined  duodenum. Recommendation:    - Perform a colonoscopy today.                    -  Await pathology results.                    - The findings and recommendations were discussed with the                     patient.                    - The findings and recommendations were discussed with the                     patient's family. Procedure Code(s): --- Professional ---                    [phone], Esophagogastroduodenoscopy, flexible, transoral;                     with biopsy, single or multiple CPT copyright 2014 American Medical Association. All rights reserved. The codes documented in this report are preliminary and upon coder review may  be revised to meet current compliance requirements. Mellody Life, MD 08/01/2015 10:50:36 AM This report has been signed electronically. Number of Addenda: 0 Note Initiated On: 08/01/2015 10:34 AM      [facility]

## 2015-08-02 LAB — SURGICAL PATHOLOGY

## 2015-08-03 ENCOUNTER — Encounter: Payer: Self-pay | Admitting: Gastroenterology

## 2015-11-04 DIAGNOSIS — I10 Essential (primary) hypertension: Secondary | ICD-10-CM | POA: Diagnosis not present

## 2015-11-04 DIAGNOSIS — Z79899 Other long term (current) drug therapy: Secondary | ICD-10-CM | POA: Diagnosis not present

## 2015-11-04 DIAGNOSIS — E049 Nontoxic goiter, unspecified: Secondary | ICD-10-CM | POA: Diagnosis not present

## 2015-11-04 DIAGNOSIS — E782 Mixed hyperlipidemia: Secondary | ICD-10-CM | POA: Diagnosis not present

## 2015-11-10 DIAGNOSIS — D692 Other nonthrombocytopenic purpura: Secondary | ICD-10-CM | POA: Diagnosis not present

## 2015-11-10 DIAGNOSIS — L820 Inflamed seborrheic keratosis: Secondary | ICD-10-CM | POA: Diagnosis not present

## 2015-11-10 DIAGNOSIS — L821 Other seborrheic keratosis: Secondary | ICD-10-CM | POA: Diagnosis not present

## 2015-11-10 DIAGNOSIS — L578 Other skin changes due to chronic exposure to nonionizing radiation: Secondary | ICD-10-CM | POA: Diagnosis not present

## 2015-11-14 DIAGNOSIS — D649 Anemia, unspecified: Secondary | ICD-10-CM | POA: Diagnosis not present

## 2015-11-14 DIAGNOSIS — Z79899 Other long term (current) drug therapy: Secondary | ICD-10-CM | POA: Diagnosis not present

## 2015-11-14 DIAGNOSIS — I10 Essential (primary) hypertension: Secondary | ICD-10-CM | POA: Diagnosis not present

## 2015-11-14 DIAGNOSIS — J069 Acute upper respiratory infection, unspecified: Secondary | ICD-10-CM | POA: Diagnosis not present

## 2015-11-14 DIAGNOSIS — Z0000 Encounter for general adult medical examination without abnormal findings: Secondary | ICD-10-CM | POA: Diagnosis not present

## 2015-11-14 DIAGNOSIS — E7800 Pure hypercholesterolemia, unspecified: Secondary | ICD-10-CM | POA: Diagnosis not present

## 2015-11-14 DIAGNOSIS — E538 Deficiency of other specified B group vitamins: Secondary | ICD-10-CM | POA: Diagnosis not present

## 2015-11-14 DIAGNOSIS — N289 Disorder of kidney and ureter, unspecified: Secondary | ICD-10-CM | POA: Diagnosis not present

## 2015-11-14 DIAGNOSIS — E782 Mixed hyperlipidemia: Secondary | ICD-10-CM | POA: Diagnosis not present

## 2015-11-14 DIAGNOSIS — E049 Nontoxic goiter, unspecified: Secondary | ICD-10-CM | POA: Diagnosis not present

## 2015-11-23 DIAGNOSIS — J209 Acute bronchitis, unspecified: Secondary | ICD-10-CM | POA: Diagnosis not present

## 2015-11-23 DIAGNOSIS — J019 Acute sinusitis, unspecified: Secondary | ICD-10-CM | POA: Diagnosis not present

## 2016-01-03 DIAGNOSIS — L820 Inflamed seborrheic keratosis: Secondary | ICD-10-CM | POA: Diagnosis not present

## 2016-01-03 DIAGNOSIS — D692 Other nonthrombocytopenic purpura: Secondary | ICD-10-CM | POA: Diagnosis not present

## 2016-01-03 DIAGNOSIS — L821 Other seborrheic keratosis: Secondary | ICD-10-CM | POA: Diagnosis not present

## 2016-01-03 DIAGNOSIS — I788 Other diseases of capillaries: Secondary | ICD-10-CM | POA: Diagnosis not present

## 2016-01-03 DIAGNOSIS — Z1283 Encounter for screening for malignant neoplasm of skin: Secondary | ICD-10-CM | POA: Diagnosis not present

## 2016-01-03 DIAGNOSIS — I8391 Asymptomatic varicose veins of right lower extremity: Secondary | ICD-10-CM | POA: Diagnosis not present

## 2016-01-03 DIAGNOSIS — L578 Other skin changes due to chronic exposure to nonionizing radiation: Secondary | ICD-10-CM | POA: Diagnosis not present

## 2016-02-03 DIAGNOSIS — H2513 Age-related nuclear cataract, bilateral: Secondary | ICD-10-CM | POA: Diagnosis not present

## 2016-02-03 DIAGNOSIS — Z79899 Other long term (current) drug therapy: Secondary | ICD-10-CM | POA: Diagnosis not present

## 2016-02-06 DIAGNOSIS — N289 Disorder of kidney and ureter, unspecified: Secondary | ICD-10-CM | POA: Diagnosis not present

## 2016-02-06 DIAGNOSIS — I10 Essential (primary) hypertension: Secondary | ICD-10-CM | POA: Diagnosis not present

## 2016-02-06 DIAGNOSIS — F411 Generalized anxiety disorder: Secondary | ICD-10-CM | POA: Diagnosis not present

## 2016-02-06 DIAGNOSIS — E010 Iodine-deficiency related diffuse (endemic) goiter: Secondary | ICD-10-CM | POA: Diagnosis not present

## 2016-02-06 DIAGNOSIS — F331 Major depressive disorder, recurrent, moderate: Secondary | ICD-10-CM | POA: Diagnosis not present

## 2016-02-06 DIAGNOSIS — E782 Mixed hyperlipidemia: Secondary | ICD-10-CM | POA: Diagnosis not present

## 2016-02-06 DIAGNOSIS — D649 Anemia, unspecified: Secondary | ICD-10-CM | POA: Diagnosis not present

## 2016-02-06 DIAGNOSIS — Z79899 Other long term (current) drug therapy: Secondary | ICD-10-CM | POA: Diagnosis not present

## 2016-02-10 DIAGNOSIS — H6123 Impacted cerumen, bilateral: Secondary | ICD-10-CM | POA: Diagnosis not present

## 2016-02-17 DIAGNOSIS — H2513 Age-related nuclear cataract, bilateral: Secondary | ICD-10-CM | POA: Diagnosis not present

## 2016-02-21 DIAGNOSIS — R079 Chest pain, unspecified: Secondary | ICD-10-CM | POA: Diagnosis not present

## 2016-02-21 DIAGNOSIS — N289 Disorder of kidney and ureter, unspecified: Secondary | ICD-10-CM | POA: Diagnosis not present

## 2016-02-21 DIAGNOSIS — I495 Sick sinus syndrome: Secondary | ICD-10-CM | POA: Diagnosis not present

## 2016-02-21 DIAGNOSIS — I878 Other specified disorders of veins: Secondary | ICD-10-CM | POA: Diagnosis not present

## 2016-02-21 DIAGNOSIS — I420 Dilated cardiomyopathy: Secondary | ICD-10-CM | POA: Diagnosis not present

## 2016-02-21 DIAGNOSIS — Z950 Presence of cardiac pacemaker: Secondary | ICD-10-CM | POA: Diagnosis not present

## 2016-02-21 DIAGNOSIS — I10 Essential (primary) hypertension: Secondary | ICD-10-CM | POA: Diagnosis not present

## 2016-02-21 DIAGNOSIS — E782 Mixed hyperlipidemia: Secondary | ICD-10-CM | POA: Diagnosis not present

## 2016-02-27 DIAGNOSIS — H2513 Age-related nuclear cataract, bilateral: Secondary | ICD-10-CM | POA: Diagnosis not present

## 2016-03-01 ENCOUNTER — Encounter: Payer: Self-pay | Admitting: *Deleted

## 2016-03-04 NOTE — H&P (Signed)
See scanned note.

## 2016-03-05 ENCOUNTER — Encounter
Admission: RE | Disposition: A | Discharge status: Home / Self Care | Payer: Self-pay | Source: Ambulatory Visit | Attending: Ophthalmology

## 2016-03-05 ENCOUNTER — Ambulatory Visit
Admission: RE | Admit: 2016-03-05 | Discharge: 2016-03-05 | Disposition: A | Discharge status: Home / Self Care | Payer: PPO | Source: Ambulatory Visit | Attending: Ophthalmology | Admitting: Ophthalmology

## 2016-03-05 ENCOUNTER — Ambulatory Visit: Payer: PPO | Admitting: Anesthesiology

## 2016-03-05 ENCOUNTER — Encounter: Payer: Self-pay | Admitting: *Deleted

## 2016-03-05 DIAGNOSIS — H2513 Age-related nuclear cataract, bilateral: Secondary | ICD-10-CM | POA: Diagnosis not present

## 2016-03-05 DIAGNOSIS — Z888 Allergy status to other drugs, medicaments and biological substances status: Secondary | ICD-10-CM | POA: Diagnosis not present

## 2016-03-05 DIAGNOSIS — H2512 Age-related nuclear cataract, left eye: Secondary | ICD-10-CM | POA: Insufficient documentation

## 2016-03-05 DIAGNOSIS — I499 Cardiac arrhythmia, unspecified: Secondary | ICD-10-CM | POA: Diagnosis not present

## 2016-03-05 DIAGNOSIS — K219 Gastro-esophageal reflux disease without esophagitis: Secondary | ICD-10-CM | POA: Diagnosis not present

## 2016-03-05 DIAGNOSIS — Z95810 Presence of automatic (implantable) cardiac defibrillator: Secondary | ICD-10-CM | POA: Diagnosis not present

## 2016-03-05 DIAGNOSIS — Z881 Allergy status to other antibiotic agents status: Secondary | ICD-10-CM | POA: Insufficient documentation

## 2016-03-05 DIAGNOSIS — F329 Major depressive disorder, single episode, unspecified: Secondary | ICD-10-CM | POA: Insufficient documentation

## 2016-03-05 DIAGNOSIS — D649 Anemia, unspecified: Secondary | ICD-10-CM | POA: Diagnosis not present

## 2016-03-05 DIAGNOSIS — M1990 Unspecified osteoarthritis, unspecified site: Secondary | ICD-10-CM | POA: Insufficient documentation

## 2016-03-05 DIAGNOSIS — I10 Essential (primary) hypertension: Secondary | ICD-10-CM | POA: Insufficient documentation

## 2016-03-05 DIAGNOSIS — Z880 Allergy status to penicillin: Secondary | ICD-10-CM | POA: Diagnosis not present

## 2016-03-05 DIAGNOSIS — E7800 Pure hypercholesterolemia, unspecified: Secondary | ICD-10-CM | POA: Diagnosis not present

## 2016-03-05 HISTORY — DX: Major depressive disorder, single episode, unspecified: F32.9

## 2016-03-05 HISTORY — DX: Presence of automatic (implantable) cardiac defibrillator: Z95.810

## 2016-03-05 HISTORY — DX: Presence of external hearing-aid: Z97.4

## 2016-03-05 HISTORY — PX: CATARACT EXTRACTION W/PHACO: SHX586

## 2016-03-05 HISTORY — DX: Depression, unspecified: F32.A

## 2016-03-05 HISTORY — DX: Spinal stenosis, site unspecified: M48.00

## 2016-03-05 SURGERY — PHACOEMULSIFICATION, CATARACT, WITH IOL INSERTION
Anesthesia: Monitor Anesthesia Care | Site: Eye | Laterality: Left | Wound class: Clean

## 2016-03-05 MED ORDER — CARBACHOL 0.01 % IO SOLN
INTRAOCULAR | Status: DC | PRN
  Administered 2016-03-05: .5 mL via INTRAOCULAR

## 2016-03-05 MED ORDER — EPINEPHRINE HCL 1 MG/ML IJ SOLN
INTRAOCULAR | Status: DC | PRN
  Administered 2016-03-05: 1 mL via OPHTHALMIC

## 2016-03-05 MED ORDER — MOXIFLOXACIN HCL 0.5 % OP SOLN
1.0000 [drp] | OPHTHALMIC | Status: AC | PRN
  Administered 2016-03-05 (×3): 1 [drp] via OPHTHALMIC

## 2016-03-05 MED ORDER — EPINEPHRINE HCL 1 MG/ML IJ SOLN
INTRAMUSCULAR | Status: AC
  Filled 2016-03-05: qty 2

## 2016-03-05 MED ORDER — TETRACAINE HCL 0.5 % OP SOLN
OPHTHALMIC | Status: AC
  Filled 2016-03-05: qty 2

## 2016-03-05 MED ORDER — CEFUROXIME OPHTHALMIC INJECTION 1 MG/0.1 ML
INJECTION | OPHTHALMIC | Status: AC
  Filled 2016-03-05: qty 0.1

## 2016-03-05 MED ORDER — PHENYLEPHRINE HCL 10 % OP SOLN
OPHTHALMIC | Status: AC
  Administered 2016-03-05: 1 [drp] via OPHTHALMIC
  Filled 2016-03-05: qty 5

## 2016-03-05 MED ORDER — NA CHONDROIT SULF-NA HYALURON 40-17 MG/ML IO SOLN
INTRAOCULAR | Status: AC
  Filled 2016-03-05: qty 1

## 2016-03-05 MED ORDER — BUPIVACAINE HCL (PF) 0.75 % IJ SOLN
INTRAMUSCULAR | Status: AC
  Filled 2016-03-05: qty 10

## 2016-03-05 MED ORDER — LIDOCAINE HCL (PF) 4 % IJ SOLN
INTRAMUSCULAR | Status: AC
  Filled 2016-03-05: qty 5

## 2016-03-05 MED ORDER — TETRACAINE HCL 0.5 % OP SOLN
OPHTHALMIC | Status: DC | PRN
  Administered 2016-03-05: 1 [drp] via OPHTHALMIC

## 2016-03-05 MED ORDER — POVIDONE-IODINE 5 % OP SOLN
OPHTHALMIC | Status: AC
  Filled 2016-03-05: qty 30

## 2016-03-05 MED ORDER — LIDOCAINE HCL (PF) 4 % IJ SOLN
INTRAOCULAR | Status: DC | PRN
  Administered 2016-03-05: .5 mL via OPHTHALMIC

## 2016-03-05 MED ORDER — POVIDONE-IODINE 5 % OP SOLN
OPHTHALMIC | Status: DC | PRN
  Administered 2016-03-05: 1 via OPHTHALMIC

## 2016-03-05 MED ORDER — NA CHONDROIT SULF-NA HYALURON 40-17 MG/ML IO SOLN
INTRAOCULAR | Status: DC | PRN
  Administered 2016-03-05: 1 mL via INTRAOCULAR

## 2016-03-05 MED ORDER — MOXIFLOXACIN HCL 0.5 % OP SOLN
OPHTHALMIC | Status: DC | PRN
  Administered 2016-03-05: 1 [drp] via OPHTHALMIC

## 2016-03-05 MED ORDER — MIDAZOLAM HCL 5 MG/5ML IJ SOLN
INTRAMUSCULAR | Status: DC | PRN
  Administered 2016-03-05: 1 mg via INTRAVENOUS

## 2016-03-05 MED ORDER — CYCLOPENTOLATE HCL 2 % OP SOLN
1.0000 [drp] | OPHTHALMIC | Status: AC | PRN
  Administered 2016-03-05 (×4): 1 [drp] via OPHTHALMIC

## 2016-03-05 MED ORDER — HYALURONIDASE HUMAN 150 UNIT/ML IJ SOLN
INTRAMUSCULAR | Status: AC
  Filled 2016-03-05: qty 1

## 2016-03-05 MED ORDER — LIDOCAINE HCL (PF) 4 % IJ SOLN
INTRAMUSCULAR | Status: DC | PRN
  Administered 2016-03-05: 4 mL via OPHTHALMIC

## 2016-03-05 MED ORDER — MOXIFLOXACIN HCL 0.5 % OP SOLN
OPHTHALMIC | Status: AC
  Administered 2016-03-05: 1 [drp] via OPHTHALMIC
  Filled 2016-03-05: qty 3

## 2016-03-05 MED ORDER — PHENYLEPHRINE HCL 10 % OP SOLN
1.0000 [drp] | OPHTHALMIC | Status: AC | PRN
  Administered 2016-03-05 (×4): 1 [drp] via OPHTHALMIC

## 2016-03-05 MED ORDER — ALFENTANIL 500 MCG/ML IJ INJ
INJECTION | INTRAMUSCULAR | Status: DC | PRN
  Administered 2016-03-05: 500 ug via INTRAVENOUS

## 2016-03-05 MED ORDER — SODIUM CHLORIDE 0.9 % IV SOLN
INTRAVENOUS | Status: DC
  Administered 2016-03-05: 09:00:00 via INTRAVENOUS

## 2016-03-05 MED ORDER — CYCLOPENTOLATE HCL 2 % OP SOLN
OPHTHALMIC | Status: AC
  Administered 2016-03-05: 1 [drp] via OPHTHALMIC
  Filled 2016-03-05: qty 2

## 2016-03-05 SURGICAL SUPPLY — 30 items
CANNULA ANT/CHMB 27G (MISCELLANEOUS) ×1 IMPLANT
CANNULA ANT/CHMB 27GA (MISCELLANEOUS) ×3 IMPLANT
CORD BIP STRL DISP 12FT (MISCELLANEOUS) ×3 IMPLANT
CUP MEDICINE 2OZ PLAST GRAD ST (MISCELLANEOUS) ×3 IMPLANT
DRAPE XRAY CASSETTE 23X24 (DRAPES) ×3 IMPLANT
ERASER HMR WETFIELD 18G (MISCELLANEOUS) ×3 IMPLANT
GLOVE BIO SURGEON STRL SZ8 (GLOVE) ×3 IMPLANT
GLOVE SURG LX 6.5 MICRO (GLOVE) ×2
GLOVE SURG LX 8.0 MICRO (GLOVE) ×2
GLOVE SURG LX STRL 6.5 MICRO (GLOVE) ×1 IMPLANT
GLOVE SURG LX STRL 8.0 MICRO (GLOVE) ×1 IMPLANT
GOWN STRL REUS W/ TWL LRG LVL3 (GOWN DISPOSABLE) ×1 IMPLANT
GOWN STRL REUS W/ TWL XL LVL3 (GOWN DISPOSABLE) ×1 IMPLANT
GOWN STRL REUS W/TWL LRG LVL3 (GOWN DISPOSABLE) ×3
GOWN STRL REUS W/TWL XL LVL3 (GOWN DISPOSABLE) ×3
LENS IOL ACRYSOF IQ 20.0 (Intraocular Lens) ×2 IMPLANT
PACK CATARACT (MISCELLANEOUS) ×3 IMPLANT
PACK CATARACT DINGLEDEIN LX (MISCELLANEOUS) ×3 IMPLANT
PACK EYE AFTER SURG (MISCELLANEOUS) ×3 IMPLANT
SHLD EYE VISITEC  UNIV (MISCELLANEOUS) ×3 IMPLANT
SOL BSS BAG (MISCELLANEOUS) ×3
SOL PREP PVP 2OZ (MISCELLANEOUS) ×3
SOLUTION BSS BAG (MISCELLANEOUS) ×1 IMPLANT
SOLUTION PREP PVP 2OZ (MISCELLANEOUS) ×1 IMPLANT
SUT SILK 5-0 (SUTURE) ×3 IMPLANT
SYR 3ML LL SCALE MARK (SYRINGE) ×3 IMPLANT
SYR 5ML LL (SYRINGE) ×3 IMPLANT
SYR TB 1ML 27GX1/2 LL (SYRINGE) ×3 IMPLANT
WATER STERILE IRR 1000ML POUR (IV SOLUTION) ×3 IMPLANT
WIPE NON LINTING 3.25X3.25 (MISCELLANEOUS) ×3 IMPLANT

## 2016-03-05 NOTE — Anesthesia Postprocedure Evaluation (Signed)
Anesthesia Post Note  Patient: Michele Montgomery  Procedure(s) Performed: Procedure(s) (LRB): CATARACT EXTRACTION PHACO AND INTRAOCULAR LENS PLACEMENT (IOC) (Left)  Patient location during evaluation: PACU Anesthesia Type: MAC Level of consciousness: awake and alert and oriented Pain management: satisfactory to patient Vital Signs Assessment: post-procedure vital signs reviewed and stable Respiratory status: respiratory function stable Cardiovascular status: stable Postop Assessment: no signs of nausea or vomiting Anesthetic complications: no    Last Vitals:  Filed Vitals:   03/05/16 0850  BP: 176/66  Pulse: 62  Temp: 36.8 C  Resp: 18    Last Pain: There were no vitals filed for this visit.               Silvana Newness A

## 2016-03-05 NOTE — Discharge Instructions (Signed)
Eye Surgery Discharge Instructions  Expect mild scratchy sensation or mild soreness. DO NOT RUB YOUR EYE!  The day of surgery:  Minimal physical activity, but bed rest is not required  No reading, computer work, or close hand work  No bending, lifting, or straining.  May watch TV  For 24 hours:  No driving, legal decisions, or alcoholic beverages  Safety precautions  Eat anything you prefer: It is better to start with liquids, then soup then solid foods.  _____ Eye patch should be worn until postoperative exam tomorrow.  ____ Solar shield eyeglasses should be worn for comfort in the sunlight/patch while sleeping  Resume all regular medications including aspirin or Coumadin if these were discontinued prior to surgery. You may shower, bathe, shave, or wash your hair. Tylenol may be taken for mild discomfort.  Call your doctor if you experience significant pain, nausea, or vomiting, fever > 101 or other signs of infection. [phone] or [phone] Specific instructions:  Follow-up Information    Follow up with Estill Cotta, MD.   Specialty:  Ophthalmology   Why:  follow up 5/23 at [facility] information:   [place] [address] [phone]     AMBULATORY SURGERY  DISCHARGE INSTRUCTIONS   1) The drugs that you were given will stay in your system until tomorrow so for the next 24 hours you should not:  A) Drive an automobile B) Make any legal decisions C) Drink any alcoholic beverage   2) You may resume regular meals tomorrow.  Today it is better to start with liquids and gradually work up to solid foods.  You may eat anything you prefer, but it is better to start with liquids, then soup and crackers, and gradually work up to solid foods.   3) Please notify your doctor immediately if you have any unusual bleeding, trouble breathing, redness and pain at the surgery site, drainage, fever, or pain not relieved by  medication.    4) Additional Instructions:        Please contact your physician with any problems or Same Day Surgery at [phone], Monday through Friday 6 am to 4 pm, or [place] at [facility] number at [phone].

## 2016-03-05 NOTE — Transfer of Care (Signed)
Immediate Anesthesia Transfer of Care Note  Patient: Michele Montgomery  Procedure(s) Performed: Procedure(s) with comments: CATARACT EXTRACTION PHACO AND INTRAOCULAR LENS PLACEMENT (IOC) (Left) - [place] 00:58 AP% 23.9 CDE 24.51 fluid pack lot # [id number]  Patient Location: PACU  Anesthesia Type:MAC  Level of Consciousness: awake, alert , oriented and patient cooperative  Airway & Oxygen Therapy: Patient Spontanous Breathing  Post-op Assessment: Report given to RN, Post -op Vital signs reviewed and stable and Patient moving all extremities X 4  Post vital signs: Reviewed and stable  Last Vitals:  Filed Vitals:   03/05/16 0850  BP: 176/66  Pulse: 62  Temp: 36.8 C  Resp: 18    Last Pain: There were no vitals filed for this visit.       Complications: No apparent anesthesia complications

## 2016-03-05 NOTE — Op Note (Signed)
Date of Surgery: 03/05/2016 Date of Dictation: 03/05/2016 11:11 AM Pre-operative Diagnosis:  Nuclear Sclerotic Cataract left Eye Post-operative Diagnosis: same Procedure performed: Extra-capsular Cataract Extraction (ECCE) with placement of a posterior chamber intraocular lens (IOL) left Eye IOL:  Implant Name Type Inv. Item Serial No. Manufacturer Lot No. LRB No. Used  LENS IOL ACRYSOF IQ 20.0 - [id number] Intraocular Lens LENS IOL ACRYSOF IQ 20.0 [id number] ALCON  Left 1  LENS IOL ACRYSOF IQ 20.0 - [id number] Intraocular Lens LENS IOL ACRYSOF IQ 20.0 [id number] ALCON [id number] Left 1   Anesthesia: 2% Lidocaine and 4% Marcaine in a 50/50 mixture with 10 unites/ml of Hylenex given as a peribulbar Anesthesiologist: Anesthesiologist: Gunnar Fusi, MD CRNA: Silvana Newness, CRNA Complications: none Estimated Blood Loss: less than 1 ml  Description of procedure:  The patient was given anesthesia and sedation via intravenous access. The patient was then prepped and draped in the usual fashion. A 25-gauge needle was bent for initiating the capsulorhexis. A 5-0 silk suture was placed through the conjunctiva superior and inferiorly to serve as bridle sutures. Hemostasis was obtained at the superior limbus using an eraser cautery. A partial thickness groove was made at the anterior surgical limbus with a 64 Beaver blade and this was dissected anteriorly with an Avaya. The anterior chamber was entered at 10 o'clock with a 1.0 mm paracentesis knife and through the lamellar dissection with a 2.6 mm Alcon keratome. Epi-Shugarcaine 0.5 CC [9 cc BSS Plus (Alcon), 3 cc 4% preservative-free lidocaine (Hospira) and 4 cc 1:1000 preservative-free, bisulfite-free epinephrine] was injected into the anterior chamber via the paracentesis tract. Epi-Shugarcaine 0.5 CC [9 cc BSS Plus (Alcon), 3 cc 4% preservative-free lidocaine (Hospira) and 4 cc 1:1000 preservative-free, bisulfite-free  epinephrine] was injected into the anterior chamber via the paracentesis tract. DiscoVisc was injected to replace the aqueous and a continuous tear curvilinear capsulorhexis was performed using a bent 25-gauge needle.  Balance salt on a syringe was used to perform hydro-dissection and phacoemulsification was carried out using a divide and conquer technique. Procedure(s) with comments: CATARACT EXTRACTION PHACO AND INTRAOCULAR LENS PLACEMENT (IOC) (Left) - [place] 00:58 AP% 23.9 CDE 24.51 fluid pack lot # [id number]. Irrigation/aspiration was used to remove the residual cortex and the capsular bag was inflated with DiscoVisc. The intraocular lens was inserted into the capsular bag using a pre-loaded UltraSert Delivery System. Irrigation/aspiration was used to remove the residual DiscoVisc. The wound was inflated with balanced salt and checked for leaks. None were found. Miostat was injected via the paracentesis track and 0.1 ml of Vigamox containing 1 mg of drug  was injected via the paracentesis track. The wound was checked for leaks again and none were found.   The bridal sutures were removed and two drops of Vigamox were placed on the eye. An eye shield was placed to protect the eye and the patient was discharged to the recovery area in good condition.   Dravon Nott MD

## 2016-03-05 NOTE — Anesthesia Preprocedure Evaluation (Signed)
Anesthesia Evaluation  Patient identified by MRN, date of birth, ID band Patient awake    Reviewed: Allergy & Precautions, NPO status , Patient's Chart, lab work & pertinent test results  History of Anesthesia Complications Negative for: history of anesthetic complications  Airway Mallampati: II       Dental   Pulmonary neg pulmonary ROS,           Cardiovascular hypertension, Pt. on medications + dysrhythmias (bradycardia) + pacemaker + Cardiac Defibrillator      Neuro/Psych Depression    GI/Hepatic Neg liver ROS, GERD  Medicated and Controlled,  Endo/Other  negative endocrine ROS  Renal/GU negative Renal ROS     Musculoskeletal  (+) Arthritis , Osteoarthritis,    Abdominal   Peds  Hematology  (+) anemia ,   Anesthesia Other Findings   Reproductive/Obstetrics                             Anesthesia Physical Anesthesia Plan  ASA: III  Anesthesia Plan: MAC   Post-op Pain Management:    Induction: Intravenous  Airway Management Planned:   Additional Equipment:   Intra-op Plan:   Post-operative Plan:   Informed Consent: I have reviewed the patients History and Physical, chart, labs and discussed the procedure including the risks, benefits and alternatives for the proposed anesthesia with the patient or authorized representative who has indicated his/her understanding and acceptance.     Plan Discussed with:   Anesthesia Plan Comments:         Anesthesia Quick Evaluation

## 2016-03-05 NOTE — Interval H&P Note (Signed)
History and Physical Interval Note:  03/05/2016 10:25 AM  Michele Montgomery  has presented today for surgery, with the diagnosis of cataract  The various methods of treatment have been discussed with the patient and family. After consideration of risks, benefits and other options for treatment, the patient has consented to  Procedure(s): CATARACT EXTRACTION PHACO AND INTRAOCULAR LENS PLACEMENT (Agency) (Left) as a surgical intervention .  The patient's history has been reviewed, patient examined, no change in status, stable for surgery.  I have reviewed the patient's chart and labs.  Questions were answered to the patient's satisfaction.     [name]

## 2016-03-28 DIAGNOSIS — Z961 Presence of intraocular lens: Secondary | ICD-10-CM | POA: Diagnosis not present

## 2016-04-02 DIAGNOSIS — M542 Cervicalgia: Secondary | ICD-10-CM | POA: Diagnosis not present

## 2016-05-04 DIAGNOSIS — I10 Essential (primary) hypertension: Secondary | ICD-10-CM | POA: Diagnosis not present

## 2016-05-04 DIAGNOSIS — E782 Mixed hyperlipidemia: Secondary | ICD-10-CM | POA: Diagnosis not present

## 2016-05-04 DIAGNOSIS — Z79899 Other long term (current) drug therapy: Secondary | ICD-10-CM | POA: Diagnosis not present

## 2016-05-04 DIAGNOSIS — E010 Iodine-deficiency related diffuse (endemic) goiter: Secondary | ICD-10-CM | POA: Diagnosis not present

## 2016-05-07 DIAGNOSIS — D649 Anemia, unspecified: Secondary | ICD-10-CM | POA: Diagnosis not present

## 2016-05-07 DIAGNOSIS — E782 Mixed hyperlipidemia: Secondary | ICD-10-CM | POA: Diagnosis not present

## 2016-05-07 DIAGNOSIS — E010 Iodine-deficiency related diffuse (endemic) goiter: Secondary | ICD-10-CM | POA: Diagnosis not present

## 2016-05-07 DIAGNOSIS — Z1239 Encounter for other screening for malignant neoplasm of breast: Secondary | ICD-10-CM | POA: Diagnosis not present

## 2016-05-07 DIAGNOSIS — I10 Essential (primary) hypertension: Secondary | ICD-10-CM | POA: Diagnosis not present

## 2016-05-16 ENCOUNTER — Other Ambulatory Visit: Payer: Self-pay | Admitting: Internal Medicine

## 2016-05-16 DIAGNOSIS — Z1231 Encounter for screening mammogram for malignant neoplasm of breast: Secondary | ICD-10-CM

## 2016-05-27 ENCOUNTER — Emergency Department: Payer: PPO

## 2016-05-27 ENCOUNTER — Encounter: Payer: Self-pay | Admitting: *Deleted

## 2016-05-27 ENCOUNTER — Emergency Department
Admission: EM | Admit: 2016-05-27 | Discharge: 2016-05-27 | Disposition: A | Discharge status: Home / Self Care | Payer: PPO | Attending: Emergency Medicine | Admitting: Emergency Medicine

## 2016-05-27 DIAGNOSIS — Y929 Unspecified place or not applicable: Secondary | ICD-10-CM | POA: Insufficient documentation

## 2016-05-27 DIAGNOSIS — Z7982 Long term (current) use of aspirin: Secondary | ICD-10-CM | POA: Insufficient documentation

## 2016-05-27 DIAGNOSIS — S8261XA Displaced fracture of lateral malleolus of right fibula, initial encounter for closed fracture: Secondary | ICD-10-CM | POA: Diagnosis not present

## 2016-05-27 DIAGNOSIS — S8262XA Displaced fracture of lateral malleolus of left fibula, initial encounter for closed fracture: Secondary | ICD-10-CM | POA: Diagnosis not present

## 2016-05-27 DIAGNOSIS — I10 Essential (primary) hypertension: Secondary | ICD-10-CM | POA: Insufficient documentation

## 2016-05-27 DIAGNOSIS — Z79899 Other long term (current) drug therapy: Secondary | ICD-10-CM | POA: Diagnosis not present

## 2016-05-27 DIAGNOSIS — Y999 Unspecified external cause status: Secondary | ICD-10-CM | POA: Diagnosis not present

## 2016-05-27 DIAGNOSIS — M25572 Pain in left ankle and joints of left foot: Secondary | ICD-10-CM | POA: Diagnosis not present

## 2016-05-27 DIAGNOSIS — X501XXA Overexertion from prolonged static or awkward postures, initial encounter: Secondary | ICD-10-CM | POA: Diagnosis not present

## 2016-05-27 DIAGNOSIS — Y939 Activity, unspecified: Secondary | ICD-10-CM | POA: Insufficient documentation

## 2016-05-27 MED ORDER — HYDROCODONE-ACETAMINOPHEN 5-325 MG PO TABS: 1.0000 | ORAL_TABLET | Freq: Four times a day (QID) | ORAL | 0 refills | Status: DC | PRN

## 2016-05-27 NOTE — ED Notes (Signed)
See triage note   States she got up too fast and her foot was asleep  Fell having pain at left ankle area  No deformity noted

## 2016-05-27 NOTE — ED Triage Notes (Signed)
Pt jumped out lost footing and fell on left ankle, no LOC or any other symptoms

## 2016-05-27 NOTE — ED Notes (Signed)

## 2016-05-27 NOTE — ED Provider Notes (Signed)
[facility] Emergency Department Provider Note  ____________________________________________  Time seen: Approximately 10:16 AM  I have reviewed the triage vital signs and the nursing notes.   HISTORY  Chief Complaint Ankle Pain    HPI Michele Montgomery is a 75 y.o. female , NAD, presents to the emergency department with 30 minute history of left ankle pain. Patient states she "jumped out of her bed" to fast and her left foot was "asleep". States that she twisted the left ankle and had immediate pain. Has also noted bruising and swelling increased over the last hour. Denies any numbness, weakness, tingling. Denies chest pain, shortness breath, visual changes to cause the injury. Has not noted any open wounds or lacerations. States that she twisted this ankle causing a sprain approximately 30 years ago but no other injuries since that time. Has taken Aleve which has helped with pain. States she has no pain in the ankle when it is still rates a 7 out of 10 pain with movement. Is unable to bear weight on the left ankle.   Past Medical History:  Diagnosis Date  . AICD (automatic cardioverter/defibrillator) present   . Anemia   . Arthritis    osteo  . Colitis   . Depression   . Dilated cardiomyopathy ([place])   . Diverticulitis   . Dysrhythmia   . Enlarged heart   . GERD (gastroesophageal reflux disease)   . Hearing aid worn   . Hyperlipidemia   . Hypertension   . Osteoporosis   . Ovarian cyst   . Spinal stenosis   . Thyromegaly   . Venous stasis     There are no active problems to display for this patient.   Past Surgical History:  Procedure Laterality Date  . CARDIAC DEFIBRILLATOR PLACEMENT    . CATARACT EXTRACTION W/PHACO Left 03/05/2016   Procedure: CATARACT EXTRACTION PHACO AND INTRAOCULAR LENS PLACEMENT (IOC);  Surgeon: Estill Cotta, MD;  Location: [facility] ORS;  Service: Ophthalmology;  Laterality: Left;  [place] 00:58 AP% 23.9 CDE 24.51 fluid pack  lot # [id number]  . COLONOSCOPY WITH PROPOFOL N/A 08/01/2015   Procedure: COLONOSCOPY WITH PROPOFOL;  Surgeon: Josefine Class, MD;  Location: [facility] ENDOSCOPY;  Service: Endoscopy;  Laterality: N/A;  . ESOPHAGOGASTRODUODENOSCOPY (EGD) WITH PROPOFOL N/A 08/01/2015   Procedure: ESOPHAGOGASTRODUODENOSCOPY (EGD) WITH PROPOFOL;  Surgeon: Josefine Class, MD;  Location: [facility] ENDOSCOPY;  Service: Endoscopy;  Laterality: N/A;  . PACEMAKER PLACEMENT      Prior to Admission medications   Medication Sig Start Date End Date Taking? Authorizing Provider  aspirin 81 MG tablet Take 1 tablet by mouth daily. 11/25/09   Historical Provider, MD  carvedilol (COREG) 25 MG tablet Take 1 tablet by mouth 2 (two) times daily. 04/02/15   Historical Provider, MD  cetirizine (ZYRTEC) 10 MG tablet Take 10 mg by mouth daily.    Historical Provider, MD  cholecalciferol (VITAMIN D) 400 UNITS TABS tablet Take 400 Units by mouth.    Historical Provider, MD  citalopram (CELEXA) 20 MG tablet Take 20 mg by mouth daily.    Historical Provider, MD  HYDROcodone-acetaminophen (NORCO) 5-325 MG tablet Take 1 tablet by mouth every 6 (six) hours as needed for severe pain. 05/27/16   Shedric Fredericks L Emmalea Treanor, PA-C  loperamide (IMODIUM A-D) 2 MG tablet Take 2 mg by mouth 2 (two) times daily as needed for diarrhea or loose stools. Reported on 03/05/2016    Historical Provider, MD  LORazepam (ATIVAN) 0.5 MG tablet Take 1 mg  by mouth at bedtime.  06/09/15   Historical Provider, MD  ondansetron (ZOFRAN ODT) 4 MG disintegrating tablet Take 1 tablet (4 mg total) by mouth every 8 (eight) hours as needed for nausea or vomiting. Patient not taking: Reported on 03/05/2016 06/17/15   Earleen Newport, MD  pantoprazole (PROTONIX) 40 MG tablet Take 1 tablet by mouth daily. 03/29/15   Historical Provider, MD  ramipril (ALTACE) 10 MG capsule Take 1 capsule by mouth daily. 03/29/15   Historical Provider, MD  simvastatin (ZOCOR) 40 MG tablet Take 1 tablet by mouth  daily. 04/03/15   Historical Provider, MD  vitamin B-12 (CYANOCOBALAMIN) 250 MCG tablet Take 250 mcg by mouth daily.    Historical Provider, MD    Allergies Penicillins; Ciprofloxacin; Hydrochlorothiazide; Metronidazole; Other; and Prednisone  No family history on file.  Social History Social History  Substance Use Topics  . Smoking status: Never Smoker  . Smokeless tobacco: Never Used  . Alcohol use No     Review of Systems  Constitutional: No fever/chills, fatigue Eyes: No visual changes. Cardiovascular: No chest pain. Respiratory: No shortness of breath.  Musculoskeletal: Positive left ankle pain. Negative left lower leg, foot, toe pain.  Skin: Positive swelling and bruising left ankle. Negative for rash. Neurological: Negative for headaches, focal weakness or numbness. No tingling. 10-point ROS otherwise negative.  ____________________________________________   PHYSICAL EXAM:  VITAL SIGNS: ED Triage Vitals  Enc Vitals Group     BP 05/27/16 1016 (!) 146/65     Pulse Rate 05/27/16 1016 88     Resp 05/27/16 1016 (!) 22     Temp 05/27/16 1016 98.2 F (36.8 C)     Temp Source 05/27/16 1016 Oral     SpO2 05/27/16 1016 94 %     Weight 05/27/16 1007 120 lb (54.4 kg)     Height 05/27/16 1007 4\' 10"  (1.473 m)     Head Circumference --      Peak Flow --      Pain Score 05/27/16 1007 0     Pain Loc --      Pain Edu? --      Excl. in [place]? --      Constitutional: Alert and oriented. Well appearing and in no acute distress. Eyes: Conjunctivae are normal without icterus or injection Head: Atraumatic. Cardiovascular:  Good peripheral circulation with 2+ pulses noted in the left lower extremity. Respiratory: Normal respiratory effort without tachypnea or retractions.  Musculoskeletal: Tenderness to palpation over the left lateral malleolus. No laxity with anterior/posterior drawer but pain in the lateral malleolus with anterior drawer. No laxity with varus or valgus stress.  No lower extremity tenderness nor edema.  No joint effusions. Neurologic:  Normal speech and language. No gross focal neurologic deficits are appreciated.  Skin:  Swelling and blue ecchymosis noted about the left lateral malleolus. Skin is warm, dry and intact. No rash, redness, and language lacerations, skin sores noted. Psychiatric: Mood and affect are normal. Speech and behavior are normal. Patient exhibits appropriate insight and judgement.   ____________________________________________   LABS  None ____________________________________________  EKG  None ____________________________________________  RADIOLOGY I have personally viewed and evaluated these images (plain radiographs) as part of my medical decision making, as well as reviewing the written report by the radiologist.  Dg Ankle Complete Left  Result Date: 05/27/2016 CLINICAL DATA:  Lateral left ankle pain after fall EXAM: LEFT ANKLE COMPLETE - 3+ VIEW COMPARISON:  None. FINDINGS: There is prominent lateral left ankle soft  tissue swelling. There is a nondisplaced transverse lateral malleolus fracture. Vertical lucency through the posterior malleolus in the left distal tibia on the lateral view may represent a nondisplaced fracture. No additional fracture. No subluxation. No suspicious focal osseous lesion. Small Achilles left calcaneal spur. No radiopaque foreign body. IMPRESSION: 1. Nondisplaced transverse left lateral malleolus fracture with prominent lateral left ankle soft tissue swelling. 2. Possible nondisplaced posterior malleolus fracture in the left distal tibia. 3. No left ankle subluxation. Electronically Signed   By: Ilona Sorrel M.D.   On: 05/27/2016 10:33    ____________________________________________    PROCEDURES  Procedure(s) performed: None   Procedures   Medications - No data to display   ____________________________________________   INITIAL IMPRESSION / ASSESSMENT AND PLAN / ED  COURSE  Pertinent labs & imaging results that were available during my care of the patient were reviewed by me and considered in my medical decision making (see chart for details).  Clinical Course    Patient's diagnosis is consistent with closed fracture left lateral malleolus. Patient was placed in a posterior and stirrup orthoglass splint and given a walker to assist with ambulation. Patient advised to keep left lower extremity elevated when not ambulating. Keep ambulating to a minimum,. Patient will be discharged home with prescriptions for Norco to take as needed sparingly for pain. Patient was given a work note to excuse from work until cleared to return by Dr. Sabra Heck. Patient is to follow up with Dr. Sabra Heck in orthopedics in 2-3 days for further evaluation and treatment of fracture. Patient is given ED precautions to return to the ED for any worsening or new symptoms.    ____________________________________________  FINAL CLINICAL IMPRESSION(S) / ED DIAGNOSES  Final diagnoses:  Closed fracture of left lateral malleolus, initial encounter      NEW MEDICATIONS STARTED DURING THIS VISIT:  New Prescriptions   HYDROCODONE-ACETAMINOPHEN (NORCO) 5-325 MG TABLET    Take 1 tablet by mouth every 6 (six) hours as needed for severe pain.         Braxton Feathers, PA-C 05/27/16 [place], MD 05/27/16 [phone]

## 2016-05-28 ENCOUNTER — Ambulatory Visit: Payer: PPO

## 2016-05-28 DIAGNOSIS — S8265XA Nondisplaced fracture of lateral malleolus of left fibula, initial encounter for closed fracture: Secondary | ICD-10-CM | POA: Diagnosis not present

## 2016-05-31 DIAGNOSIS — R110 Nausea: Secondary | ICD-10-CM | POA: Diagnosis not present

## 2016-05-31 DIAGNOSIS — M5441 Lumbago with sciatica, right side: Secondary | ICD-10-CM | POA: Diagnosis not present

## 2016-06-04 DIAGNOSIS — M5441 Lumbago with sciatica, right side: Secondary | ICD-10-CM | POA: Diagnosis not present

## 2016-06-06 DIAGNOSIS — S8265XD Nondisplaced fracture of lateral malleolus of left fibula, subsequent encounter for closed fracture with routine healing: Secondary | ICD-10-CM | POA: Diagnosis not present

## 2016-06-19 DIAGNOSIS — S8265XD Nondisplaced fracture of lateral malleolus of left fibula, subsequent encounter for closed fracture with routine healing: Secondary | ICD-10-CM | POA: Diagnosis not present

## 2016-06-28 DIAGNOSIS — I420 Dilated cardiomyopathy: Secondary | ICD-10-CM | POA: Diagnosis not present

## 2016-06-29 DIAGNOSIS — M25572 Pain in left ankle and joints of left foot: Secondary | ICD-10-CM | POA: Diagnosis not present

## 2016-07-03 DIAGNOSIS — H2511 Age-related nuclear cataract, right eye: Secondary | ICD-10-CM | POA: Diagnosis not present

## 2016-07-10 DIAGNOSIS — M62830 Muscle spasm of back: Secondary | ICD-10-CM | POA: Diagnosis not present

## 2016-07-10 DIAGNOSIS — M5136 Other intervertebral disc degeneration, lumbar region: Secondary | ICD-10-CM | POA: Diagnosis not present

## 2016-07-10 DIAGNOSIS — M5416 Radiculopathy, lumbar region: Secondary | ICD-10-CM | POA: Diagnosis not present

## 2016-07-16 DIAGNOSIS — H2511 Age-related nuclear cataract, right eye: Secondary | ICD-10-CM | POA: Diagnosis not present

## 2016-07-20 ENCOUNTER — Ambulatory Visit
Admission: RE | Admit: 2016-07-20 | Discharge: 2016-07-20 | Disposition: A | Discharge status: Home / Self Care | Payer: PPO | Source: Ambulatory Visit | Attending: Internal Medicine | Admitting: Internal Medicine

## 2016-07-20 DIAGNOSIS — Z1231 Encounter for screening mammogram for malignant neoplasm of breast: Secondary | ICD-10-CM | POA: Diagnosis not present

## 2016-07-23 ENCOUNTER — Encounter: Payer: Self-pay | Admitting: *Deleted

## 2016-07-25 ENCOUNTER — Ambulatory Visit: Payer: PPO | Admitting: Anesthesiology

## 2016-07-25 ENCOUNTER — Encounter
Admission: RE | Disposition: A | Discharge status: Home / Self Care | Payer: Self-pay | Source: Ambulatory Visit | Attending: Ophthalmology

## 2016-07-25 ENCOUNTER — Ambulatory Visit
Admission: RE | Admit: 2016-07-25 | Discharge: 2016-07-25 | Disposition: A | Discharge status: Home / Self Care | Payer: PPO | Source: Ambulatory Visit | Attending: Ophthalmology | Admitting: Ophthalmology

## 2016-07-25 ENCOUNTER — Encounter: Payer: Self-pay | Admitting: *Deleted

## 2016-07-25 DIAGNOSIS — Z95810 Presence of automatic (implantable) cardiac defibrillator: Secondary | ICD-10-CM | POA: Diagnosis not present

## 2016-07-25 DIAGNOSIS — I10 Essential (primary) hypertension: Secondary | ICD-10-CM | POA: Insufficient documentation

## 2016-07-25 DIAGNOSIS — Z79899 Other long term (current) drug therapy: Secondary | ICD-10-CM | POA: Diagnosis not present

## 2016-07-25 DIAGNOSIS — E7800 Pure hypercholesterolemia, unspecified: Secondary | ICD-10-CM | POA: Insufficient documentation

## 2016-07-25 DIAGNOSIS — K219 Gastro-esophageal reflux disease without esophagitis: Secondary | ICD-10-CM | POA: Diagnosis not present

## 2016-07-25 DIAGNOSIS — H2511 Age-related nuclear cataract, right eye: Secondary | ICD-10-CM | POA: Insufficient documentation

## 2016-07-25 DIAGNOSIS — D649 Anemia, unspecified: Secondary | ICD-10-CM | POA: Diagnosis not present

## 2016-07-25 DIAGNOSIS — F329 Major depressive disorder, single episode, unspecified: Secondary | ICD-10-CM | POA: Insufficient documentation

## 2016-07-25 HISTORY — PX: CATARACT EXTRACTION W/PHACO: SHX586

## 2016-07-25 HISTORY — DX: Other injury of unspecified body region, initial encounter: T14.8XXA

## 2016-07-25 SURGERY — PHACOEMULSIFICATION, CATARACT, WITH IOL INSERTION
Anesthesia: General | Site: Eye | Laterality: Right | Wound class: Clean

## 2016-07-25 MED ORDER — MOXIFLOXACIN HCL 0.5 % OP SOLN
OPHTHALMIC | Status: DC | PRN
  Administered 2016-07-25: 1 [drp] via OPHTHALMIC

## 2016-07-25 MED ORDER — TETRACAINE HCL 0.5 % OP SOLN
OPHTHALMIC | Status: DC | PRN
  Administered 2016-07-25: 1 [drp] via OPHTHALMIC

## 2016-07-25 MED ORDER — PHENYLEPHRINE HCL 10 % OP SOLN
1.0000 [drp] | OPHTHALMIC | Status: DC | PRN
  Administered 2016-07-25: 1 [drp] via OPHTHALMIC

## 2016-07-25 MED ORDER — LIDOCAINE HCL (PF) 4 % IJ SOLN
INTRAMUSCULAR | Status: AC
  Filled 2016-07-25: qty 5

## 2016-07-25 MED ORDER — NA CHONDROIT SULF-NA HYALURON 40-17 MG/ML IO SOLN
INTRAOCULAR | Status: AC
  Filled 2016-07-25: qty 1

## 2016-07-25 MED ORDER — TETRACAINE HCL 0.5 % OP SOLN
OPHTHALMIC | Status: AC
  Filled 2016-07-25: qty 2

## 2016-07-25 MED ORDER — CYCLOPENTOLATE HCL 2 % OP SOLN
1.0000 [drp] | OPHTHALMIC | Status: AC
  Administered 2016-07-25: 1 [drp] via OPHTHALMIC

## 2016-07-25 MED ORDER — CYCLOPENTOLATE HCL 2 % OP SOLN
OPHTHALMIC | Status: AC
  Administered 2016-07-25: 1 [drp] via OPHTHALMIC
  Filled 2016-07-25: qty 2

## 2016-07-25 MED ORDER — POVIDONE-IODINE 5 % OP SOLN
OPHTHALMIC | Status: DC | PRN
  Administered 2016-07-25: 1 via OPHTHALMIC

## 2016-07-25 MED ORDER — SODIUM CHLORIDE 0.9 % IV SOLN
INTRAVENOUS | Status: DC
  Administered 2016-07-25: 07:00:00 via INTRAVENOUS

## 2016-07-25 MED ORDER — EPINEPHRINE PF 1 MG/ML IJ SOLN
INTRAMUSCULAR | Status: DC | PRN
  Administered 2016-07-25: 1 mL via OPHTHALMIC

## 2016-07-25 MED ORDER — BUPIVACAINE HCL (PF) 0.75 % IJ SOLN
INTRAMUSCULAR | Status: AC
  Filled 2016-07-25: qty 10

## 2016-07-25 MED ORDER — EPINEPHRINE PF 1 MG/ML IJ SOLN
INTRAMUSCULAR | Status: AC
  Filled 2016-07-25: qty 2

## 2016-07-25 MED ORDER — PHENYLEPHRINE HCL 10 % OP SOLN
OPHTHALMIC | Status: AC
  Administered 2016-07-25: 1 [drp] via OPHTHALMIC
  Filled 2016-07-25: qty 5

## 2016-07-25 MED ORDER — ONDANSETRON HCL 4 MG/2ML IJ SOLN
INTRAMUSCULAR | Status: DC | PRN
  Administered 2016-07-25: 4 mg via INTRAVENOUS

## 2016-07-25 MED ORDER — MIDAZOLAM HCL 2 MG/2ML IJ SOLN
INTRAMUSCULAR | Status: DC | PRN
  Administered 2016-07-25: 1 mg via INTRAVENOUS

## 2016-07-25 MED ORDER — MOXIFLOXACIN HCL 0.5 % OP SOLN
1.0000 [drp] | OPHTHALMIC | Status: AC
  Administered 2016-07-25: 1 [drp] via OPHTHALMIC

## 2016-07-25 MED ORDER — HYALURONIDASE HUMAN 150 UNIT/ML IJ SOLN
INTRAMUSCULAR | Status: AC
  Filled 2016-07-25: qty 1

## 2016-07-25 MED ORDER — CARBACHOL 0.01 % IO SOLN
INTRAOCULAR | Status: DC | PRN
  Administered 2016-07-25: 0.5 mL via INTRAOCULAR

## 2016-07-25 MED ORDER — NA CHONDROIT SULF-NA HYALURON 40-17 MG/ML IO SOLN
INTRAOCULAR | Status: DC | PRN
  Administered 2016-07-25: 2 mL via INTRAOCULAR

## 2016-07-25 MED ORDER — LIDOCAINE HCL (PF) 4 % IJ SOLN
INTRAMUSCULAR | Status: DC | PRN
  Administered 2016-07-25: 4 mL via OPHTHALMIC

## 2016-07-25 MED ORDER — MOXIFLOXACIN HCL 0.5 % OP SOLN
OPHTHALMIC | Status: AC
  Administered 2016-07-25: 1 [drp] via OPHTHALMIC
  Filled 2016-07-25: qty 3

## 2016-07-25 MED ORDER — ALFENTANIL 500 MCG/ML IJ INJ
INJECTION | INTRAMUSCULAR | Status: DC | PRN
  Administered 2016-07-25: 500 ug via INTRAVENOUS

## 2016-07-25 SURGICAL SUPPLY — 32 items
CANNULA ANT/CHMB 27G (MISCELLANEOUS) ×1 IMPLANT
CANNULA ANT/CHMB 27GA (MISCELLANEOUS) ×3 IMPLANT
CORD BIP STRL DISP 12FT (MISCELLANEOUS) ×3 IMPLANT
CUP MEDICINE 2OZ PLAST GRAD ST (MISCELLANEOUS) ×3 IMPLANT
DRAPE XRAY CASSETTE 23X24 (DRAPES) ×3 IMPLANT
ERASER HMR WETFIELD 18G (MISCELLANEOUS) ×3 IMPLANT
GLOVE BIO SURGEON STRL SZ8 (GLOVE) ×3 IMPLANT
GLOVE SURG LX 6.5 MICRO (GLOVE) ×2
GLOVE SURG LX 8.0 MICRO (GLOVE) ×2
GLOVE SURG LX STRL 6.5 MICRO (GLOVE) ×1 IMPLANT
GLOVE SURG LX STRL 8.0 MICRO (GLOVE) ×1 IMPLANT
GOWN STRL REUS W/ TWL LRG LVL3 (GOWN DISPOSABLE) ×1 IMPLANT
GOWN STRL REUS W/ TWL XL LVL3 (GOWN DISPOSABLE) ×1 IMPLANT
GOWN STRL REUS W/TWL LRG LVL3 (GOWN DISPOSABLE) ×3
GOWN STRL REUS W/TWL XL LVL3 (GOWN DISPOSABLE) ×3
LENS IOL ACRSF IQ ULTRA 18.5 (Intraocular Lens) IMPLANT
LENS IOL ACRYSOF IQ 18.5 (Intraocular Lens) IMPLANT
LENS IOL ACRYSOF IQ 22.0 (Intraocular Lens) ×2 IMPLANT
PACK CATARACT (MISCELLANEOUS) ×3 IMPLANT
PACK CATARACT DINGLEDEIN LX (MISCELLANEOUS) ×3 IMPLANT
PACK EYE AFTER SURG (MISCELLANEOUS) ×3 IMPLANT
SHLD EYE VISITEC  UNIV (MISCELLANEOUS) ×3 IMPLANT
SOL BSS BAG (MISCELLANEOUS) ×3
SOL PREP PVP 2OZ (MISCELLANEOUS) ×3
SOLUTION BSS BAG (MISCELLANEOUS) ×1 IMPLANT
SOLUTION PREP PVP 2OZ (MISCELLANEOUS) ×1 IMPLANT
SUT SILK 5-0 (SUTURE) ×3 IMPLANT
SYR 3ML LL SCALE MARK (SYRINGE) ×3 IMPLANT
SYR 5ML LL (SYRINGE) ×3 IMPLANT
SYR TB 1ML 27GX1/2 LL (SYRINGE) ×3 IMPLANT
WATER STERILE IRR 250ML POUR (IV SOLUTION) ×3 IMPLANT
WIPE NON LINTING 3.25X3.25 (MISCELLANEOUS) ×3 IMPLANT

## 2016-07-25 NOTE — Interval H&P Note (Signed)
History and Physical Interval Note:  07/25/2016 7:27 AM  Michele Montgomery  has presented today for surgery, with the diagnosis of NUCLEAR SCLEROTIC CATARACT RIGHT EYE  The various methods of treatment have been discussed with the patient and family. After consideration of risks, benefits and other options for treatment, the patient has consented to  Procedure(s) with comments: CATARACT EXTRACTION PHACO AND INTRAOCULAR LENS PLACEMENT (IOC) (Right) - [place] AP% CDE fluid pack lot # [id number] as a surgical intervention .  The patient's history has been reviewed, patient examined, no change in status, stable for surgery.  I have reviewed the patient's chart and labs.  Questions were answered to the patient's satisfaction.     Lutie Pickler

## 2016-07-25 NOTE — Anesthesia Postprocedure Evaluation (Signed)
Anesthesia Post Note  Patient: Michele Montgomery  Procedure(s) Performed: Procedure(s) (LRB): CATARACT EXTRACTION PHACO AND INTRAOCULAR LENS PLACEMENT (IOC) (Right)  Patient location during evaluation: PACU Anesthesia Type: General Level of consciousness: awake and alert Pain management: pain level controlled Vital Signs Assessment: post-procedure vital signs reviewed and stable Respiratory status: spontaneous breathing, nonlabored ventilation, respiratory function stable and patient connected to nasal cannula oxygen Cardiovascular status: blood pressure returned to baseline and stable Postop Assessment: no signs of nausea or vomiting Anesthetic complications: no    Last Vitals:  Vitals:   07/25/16 0610 07/25/16 0818  BP: (!) 164/64 (!) 153/67  Pulse: 61 68  Resp: 16 16  Temp: 36.6 C 36.6 C    Last Pain:  Vitals:   07/25/16 0610  TempSrc: Oral                 Molli Barrows

## 2016-07-25 NOTE — Transfer of Care (Signed)
Immediate Anesthesia Transfer of Care Note  Patient: Michele Montgomery  Procedure(s) Performed: Procedure(s) with comments: CATARACT EXTRACTION PHACO AND INTRAOCULAR LENS PLACEMENT (IOC) (Right) - [place] 1.05 AP% 20.4 CDE 21.59 fluid pack lot # [id number]  Patient Location: PACU  Anesthesia Type:General  Level of Consciousness: sedated  Airway & Oxygen Therapy: Patient Spontanous Breathing and Patient connected to face mask oxygen  Post-op Assessment: Report given to RN and Post -op Vital signs reviewed and stable  Post vital signs: Reviewed and stable  Last Vitals:  Vitals:   07/25/16 0610 07/25/16 0818  BP: (!) 164/64 (!) 153/67  Pulse: 61 68  Resp: 16 16  Temp: 36.6 C 123XX123 C    Complications: No apparent anesthesia complications

## 2016-07-25 NOTE — H&P (Signed)
See scanned note.

## 2016-07-25 NOTE — Anesthesia Preprocedure Evaluation (Signed)
Anesthesia Evaluation  Patient identified by MRN, date of birth, ID band Patient awake    Reviewed: Allergy & Precautions, H&P , NPO status , Patient's Chart, lab work & pertinent test results, reviewed documented beta blocker date and time   Airway Mallampati: II   Neck ROM: full    Dental  (+) Poor Dentition, Teeth Intact   Pulmonary neg pulmonary ROS,    Pulmonary exam normal        Cardiovascular hypertension, negative cardio ROS Normal cardiovascular exam+ dysrhythmias + Cardiac Defibrillator  Rhythm:regular Rate:Normal     Neuro/Psych PSYCHIATRIC DISORDERS negative neurological ROS  negative psych ROS   GI/Hepatic negative GI ROS, Neg liver ROS, GERD  Medicated,  Endo/Other  negative endocrine ROS  Renal/GU negative Renal ROS  negative genitourinary   Musculoskeletal   Abdominal   Peds  Hematology negative hematology ROS (+) anemia ,   Anesthesia Other Findings Past Medical History: No date: AICD (automatic cardioverter/defibrillator) pr*     Comment: 2011 No date: Anemia No date: Arthritis     Comment: osteo No date: Colitis No date: Depression No date: Dilated cardiomyopathy ([facility]) No date: Diverticulitis No date: Dysrhythmia No date: Enlarged heart No date: Fracture     Comment: LEFT ANKLE/ WEARING BRACE No date: GERD (gastroesophageal reflux disease) No date: Hearing aid worn No date: Hyperlipidemia No date: Hypertension No date: Osteoporosis No date: Ovarian cyst No date: Spinal stenosis No date: Thyromegaly No date: Venous stasis Past Surgical History: No date: CARDIAC DEFIBRILLATOR PLACEMENT No date: CARDIAC DEFIBRILLATOR PLACEMENT 03/05/2016: CATARACT EXTRACTION W/PHACO Left     Comment: Procedure: CATARACT EXTRACTION PHACO AND               INTRAOCULAR LENS PLACEMENT (IOC);  Surgeon:               Estill Cotta, MD;  Location: [facility] ORS;                Service: Ophthalmology;   Laterality: Left;  [place]               00:58 AP% 23.9 CDE 24.51 fluid pack lot #               [id number] 08/01/2015: COLONOSCOPY WITH PROPOFOL N/A     Comment: Procedure: COLONOSCOPY WITH PROPOFOL;                Surgeon: Josefine Class, MD;  Location:               [facility] ENDOSCOPY;  Service: Endoscopy;                Laterality: N/A; 08/01/2015: ESOPHAGOGASTRODUODENOSCOPY (EGD) WITH PROPOFOL N/A     Comment: Procedure: ESOPHAGOGASTRODUODENOSCOPY (EGD)               WITH PROPOFOL;  Surgeon: Josefine Class,               MD;  Location: [facility] ENDOSCOPY;  Service:               Endoscopy;  Laterality: N/A; No date: PACEMAKER PLACEMENT BMI    Body Mass Index:  25.08 kg/m     Reproductive/Obstetrics                             Anesthesia Physical Anesthesia Plan  ASA: III  Anesthesia Plan: General   Post-op Pain Management:    Induction:   Airway Management Planned:  Additional Equipment:   Intra-op Plan:   Post-operative Plan:   Informed Consent: I have reviewed the patients History and Physical, chart, labs and discussed the procedure including the risks, benefits and alternatives for the proposed anesthesia with the patient or authorized representative who has indicated his/her understanding and acceptance.   Dental Advisory Given  Plan Discussed with: CRNA  Anesthesia Plan Comments:         Anesthesia Quick Evaluation

## 2016-07-25 NOTE — Op Note (Signed)
Date of Surgery: 07/25/2016 Date of Dictation: 07/25/2016 8:15 AM Pre-operative Diagnosis:  Nuclear Sclerotic Cataract right Eye Post-operative Diagnosis: same Procedure performed: Extra-capsular Cataract Extraction (ECCE) with placement of a posterior chamber intraocular lens (IOL) right Eye IOL:  Implant Name Type Inv. Item Serial No. Manufacturer Lot No. LRB No. Used  LENS IOL ACRYSOF IQ 18.5 - [id number] Intraocular Lens LENS IOL ACRYSOF IQ 18.5 [id number] ALCON  Right 1  LENS IOL ACRYSOF IQ 22.0 - [id number] Intraocular Lens LENS IOL ACRYSOF IQ 22.0 [id number] ALCON   Right 1   Anesthesia: 2% Lidocaine and 4% Marcaine in a 50/50 mixture with 10 unites/ml of Hylenex given as a peribulbar Anesthesiologist: Anesthesiologist: Molli Barrows, MD CRNA: Doreen Salvage, CRNA Complications: none Estimated Blood Loss: less than 1 ml  Description of procedure:  The patient was given anesthesia and sedation via intravenous access. The patient was then prepped and draped in the usual fashion. A 25-gauge needle was bent for initiating the capsulorhexis. A 5-0 silk suture was placed through the conjunctiva superior and inferiorly to serve as bridle sutures. Hemostasis was obtained at the superior limbus using an eraser cautery. A partial thickness groove was made at the anterior surgical limbus with a 64 Beaver blade and this was dissected anteriorly with an Avaya. The anterior chamber was entered at 10 o'clock with a 1.0 mm paracentesis knife and through the lamellar dissection with a 2.6 mm Alcon keratome. Epi-Shugarcaine 0.5 CC [9 cc BSS Plus (Alcon), 3 cc 4% preservative-free lidocaine (Hospira) and 4 cc 1:1000 preservative-free, bisulfite-free epinephrine] was injected into the anterior chamber via the paracentesis tract. Epi-Shugarcaine 0.5 CC [9 cc BSS Plus (Alcon), 3 cc 4% preservative-free lidocaine (Hospira) and 4 cc 1:1000 preservative-free, bisulfite-free epinephrine] was  injected into the anterior chamber via the paracentesis tract. DiscoVisc was injected to replace the aqueous and a continuous tear curvilinear capsulorhexis was performed using a bent 25-gauge needle.  Balance salt on a syringe was used to perform hydro-dissection and phacoemulsification was carried out using a divide and conquer technique. Procedure(s) with comments: CATARACT EXTRACTION PHACO AND INTRAOCULAR LENS PLACEMENT (IOC) (Right) - [place] 1.05 AP% 20.4 CDE 21.59 fluid pack lot # [id number]. Irrigation/aspiration was used to remove the residual cortex and the capsular bag was inflated with DiscoVisc. The intraocular lens was inserted into the capsular bag using a pre-loaded UltraSert Delivery System. Irrigation/aspiration was used to remove the residual DiscoVisc. The wound was inflated with balanced salt and checked for leaks. None were found. Miostat was injected via the paracentesis track and 0.1 ml of Vigamox containing 1 mg of drug  was injected via the paracentesis track. The wound was checked for leaks again and none were found.   The bridal sutures were removed and two drops of Vigamox were placed on the eye. An eye shield was placed to protect the eye and the patient was discharged to the recovery area in good condition.   Derisha Funderburke MD

## 2016-07-25 NOTE — Anesthesia Procedure Notes (Signed)
Procedure Name: MAC Date/Time: 07/25/2016 7:34 AM Performed by: Doreen Salvage Pre-anesthesia Checklist: Patient identified, Emergency Drugs available, Suction available and Patient being monitored Patient Re-evaluated:Patient Re-evaluated prior to inductionOxygen Delivery Method: Nasal cannula

## 2016-07-25 NOTE — Discharge Instructions (Signed)

## 2016-07-26 DIAGNOSIS — M25572 Pain in left ankle and joints of left foot: Secondary | ICD-10-CM | POA: Diagnosis not present

## 2016-08-15 DIAGNOSIS — M62830 Muscle spasm of back: Secondary | ICD-10-CM | POA: Diagnosis not present

## 2016-08-15 DIAGNOSIS — M5416 Radiculopathy, lumbar region: Secondary | ICD-10-CM | POA: Diagnosis not present

## 2016-08-15 DIAGNOSIS — M5136 Other intervertebral disc degeneration, lumbar region: Secondary | ICD-10-CM | POA: Diagnosis not present

## 2016-08-16 DIAGNOSIS — Z961 Presence of intraocular lens: Secondary | ICD-10-CM | POA: Diagnosis not present

## 2016-08-22 DIAGNOSIS — R5383 Other fatigue: Secondary | ICD-10-CM | POA: Diagnosis not present

## 2016-08-22 DIAGNOSIS — D649 Anemia, unspecified: Secondary | ICD-10-CM | POA: Diagnosis not present

## 2016-08-22 DIAGNOSIS — I10 Essential (primary) hypertension: Secondary | ICD-10-CM | POA: Diagnosis not present

## 2016-08-22 DIAGNOSIS — I495 Sick sinus syndrome: Secondary | ICD-10-CM | POA: Diagnosis not present

## 2016-08-22 DIAGNOSIS — Z950 Presence of cardiac pacemaker: Secondary | ICD-10-CM | POA: Diagnosis not present

## 2016-08-22 DIAGNOSIS — E782 Mixed hyperlipidemia: Secondary | ICD-10-CM | POA: Diagnosis not present

## 2016-08-31 DIAGNOSIS — Z79899 Other long term (current) drug therapy: Secondary | ICD-10-CM | POA: Diagnosis not present

## 2016-09-13 DIAGNOSIS — E010 Iodine-deficiency related diffuse (endemic) goiter: Secondary | ICD-10-CM | POA: Diagnosis not present

## 2016-09-13 DIAGNOSIS — N289 Disorder of kidney and ureter, unspecified: Secondary | ICD-10-CM | POA: Diagnosis not present

## 2016-09-13 DIAGNOSIS — D649 Anemia, unspecified: Secondary | ICD-10-CM | POA: Diagnosis not present

## 2016-09-13 DIAGNOSIS — E782 Mixed hyperlipidemia: Secondary | ICD-10-CM | POA: Diagnosis not present

## 2016-09-13 DIAGNOSIS — Z79899 Other long term (current) drug therapy: Secondary | ICD-10-CM | POA: Diagnosis not present

## 2016-09-13 DIAGNOSIS — I10 Essential (primary) hypertension: Secondary | ICD-10-CM | POA: Diagnosis not present

## 2016-09-13 DIAGNOSIS — Z0000 Encounter for general adult medical examination without abnormal findings: Secondary | ICD-10-CM | POA: Diagnosis not present

## 2016-10-18 DIAGNOSIS — I420 Dilated cardiomyopathy: Secondary | ICD-10-CM | POA: Diagnosis not present

## 2016-12-07 DIAGNOSIS — Z79899 Other long term (current) drug therapy: Secondary | ICD-10-CM | POA: Diagnosis not present

## 2016-12-07 DIAGNOSIS — I10 Essential (primary) hypertension: Secondary | ICD-10-CM | POA: Diagnosis not present

## 2016-12-07 DIAGNOSIS — E010 Iodine-deficiency related diffuse (endemic) goiter: Secondary | ICD-10-CM | POA: Diagnosis not present

## 2016-12-07 DIAGNOSIS — E782 Mixed hyperlipidemia: Secondary | ICD-10-CM | POA: Diagnosis not present

## 2016-12-13 DIAGNOSIS — D649 Anemia, unspecified: Secondary | ICD-10-CM | POA: Diagnosis not present

## 2016-12-13 DIAGNOSIS — I10 Essential (primary) hypertension: Secondary | ICD-10-CM | POA: Diagnosis not present

## 2016-12-13 DIAGNOSIS — Z23 Encounter for immunization: Secondary | ICD-10-CM | POA: Diagnosis not present

## 2016-12-13 DIAGNOSIS — N289 Disorder of kidney and ureter, unspecified: Secondary | ICD-10-CM | POA: Diagnosis not present

## 2016-12-13 DIAGNOSIS — E010 Iodine-deficiency related diffuse (endemic) goiter: Secondary | ICD-10-CM | POA: Diagnosis not present

## 2016-12-13 DIAGNOSIS — E538 Deficiency of other specified B group vitamins: Secondary | ICD-10-CM | POA: Diagnosis not present

## 2016-12-13 DIAGNOSIS — E782 Mixed hyperlipidemia: Secondary | ICD-10-CM | POA: Diagnosis not present

## 2016-12-13 DIAGNOSIS — M5441 Lumbago with sciatica, right side: Secondary | ICD-10-CM | POA: Diagnosis not present

## 2016-12-25 DIAGNOSIS — R5383 Other fatigue: Secondary | ICD-10-CM | POA: Diagnosis not present

## 2016-12-25 DIAGNOSIS — I420 Dilated cardiomyopathy: Secondary | ICD-10-CM | POA: Diagnosis not present

## 2016-12-25 DIAGNOSIS — I495 Sick sinus syndrome: Secondary | ICD-10-CM | POA: Diagnosis not present

## 2017-01-01 ENCOUNTER — Other Ambulatory Visit: Payer: PPO

## 2017-01-01 ENCOUNTER — Encounter
Admission: RE | Admit: 2017-01-01 | Discharge: 2017-01-01 | Disposition: A | Discharge status: Home / Self Care | Payer: PPO | Source: Ambulatory Visit | Attending: Cardiology | Admitting: Cardiology

## 2017-01-01 ENCOUNTER — Ambulatory Visit
Admission: RE | Admit: 2017-01-01 | Discharge: 2017-01-01 | Disposition: A | Discharge status: Home / Self Care | Payer: PPO | Source: Ambulatory Visit | Attending: Cardiology | Admitting: Cardiology

## 2017-01-01 DIAGNOSIS — Z881 Allergy status to other antibiotic agents status: Secondary | ICD-10-CM | POA: Insufficient documentation

## 2017-01-01 DIAGNOSIS — Z95810 Presence of automatic (implantable) cardiac defibrillator: Secondary | ICD-10-CM | POA: Insufficient documentation

## 2017-01-01 DIAGNOSIS — R0602 Shortness of breath: Secondary | ICD-10-CM | POA: Diagnosis not present

## 2017-01-01 DIAGNOSIS — Z888 Allergy status to other drugs, medicaments and biological substances status: Secondary | ICD-10-CM | POA: Diagnosis not present

## 2017-01-01 DIAGNOSIS — Z419 Encounter for procedure for purposes other than remedying health state, unspecified: Secondary | ICD-10-CM

## 2017-01-01 DIAGNOSIS — Z880 Allergy status to penicillin: Secondary | ICD-10-CM | POA: Diagnosis not present

## 2017-01-01 DIAGNOSIS — Z01818 Encounter for other preprocedural examination: Secondary | ICD-10-CM | POA: Diagnosis not present

## 2017-01-01 DIAGNOSIS — Z01812 Encounter for preprocedural laboratory examination: Secondary | ICD-10-CM | POA: Insufficient documentation

## 2017-01-01 DIAGNOSIS — Z01810 Encounter for preprocedural cardiovascular examination: Secondary | ICD-10-CM | POA: Diagnosis not present

## 2017-01-01 LAB — CBC
HEMATOCRIT: 34.1 % — AB (ref 35.0–47.0)
HEMOGLOBIN: 11.5 g/dL — AB (ref 12.0–16.0)
MCH: 31 pg (ref 26.0–34.0)
MCHC: 33.7 g/dL (ref 32.0–36.0)
MCV: 91.9 fL (ref 80.0–100.0)
Platelets: 203 10*3/uL (ref 150–440)
RBC: 3.71 MIL/uL — ABNORMAL LOW (ref 3.80–5.20)
RDW: 13.6 % (ref 11.5–14.5)
WBC: 4.9 10*3/uL (ref 3.6–11.0)

## 2017-01-01 LAB — BASIC METABOLIC PANEL
ANION GAP: 6 (ref 5–15)
BUN: 17 mg/dL (ref 6–20)
CO2: 29 mmol/L (ref 22–32)
Calcium: 9.3 mg/dL (ref 8.9–10.3)
Chloride: 101 mmol/L (ref 101–111)
Creatinine, Ser: 1.26 mg/dL — ABNORMAL HIGH (ref 0.44–1.00)
GFR, EST AFRICAN AMERICAN: 46 mL/min — AB (ref >60)
GFR, EST NON AFRICAN AMERICAN: 40 mL/min — AB (ref >60)
Glucose, Bld: 99 mg/dL (ref 65–99)
Potassium: 4 mmol/L (ref 3.5–5.1)
SODIUM: 136 mmol/L (ref 135–145)

## 2017-01-01 LAB — PROTIME-INR
INR: 0.96
PROTHROMBIN TIME: 12.8 s (ref 11.4–15.2)

## 2017-01-01 LAB — APTT: aPTT: 32 seconds (ref 24–36)

## 2017-01-01 NOTE — Pre-Procedure Instructions (Signed)
Patient may stop ASA 81 mg 5 days before surgery per Dr. Lillie Fragmin office.  Faxed notification of anaphylactic reaction to PCN / cross allergy to cefazolin.  Request further orders.

## 2017-01-01 NOTE — Patient Instructions (Addendum)
  Your procedure is scheduled on: Wed. 01/09/17 Report to Day Surgery. To find out your arrival time please call [phone] between 1PM - 3PM on Tues 01/08/17.  Remember: Instructions that are not followed completely may result in serious medical risk, up to and including death, or upon the discretion of your surgeon and anesthesiologist your surgery may need to be rescheduled.    __x__ 1. Do not eat food or drink liquids after midnight. No gum chewing or hard candies.     __x__ 2. No Alcohol for 24 hours before or after surgery.   ____ 3. Do Not Smoke For 24 Hours Prior to Your Surgery.   ____ 4. Bring all medications with you on the day of surgery if instructed.    ___x_ 5. Notify your doctor if there is any change in your medical condition     (cold, fever, infections).       Do not wear jewelry, make-up, hairpins, clips or nail polish.  Do not wear lotions, powders, or perfumes. You may wear deodorant.  Do not shave 48 hours prior to surgery. Men may shave face and neck.  Do not bring valuables to the hospital.    [facility] is not responsible for any belongings or valuables.               Contacts, dentures or bridgework may not be worn into surgery.  Leave your suitcase in the car. After surgery it may be brought to your room.  For patients admitted to the hospital, discharge time is determined by your                treatment team.   Patients discharged the day of surgery will not be allowed to drive home.   Please read over the following fact sheets that you were given:      __x__ Take these medicines the morning of surgery with A SIP OF WATER:    1. carvedilol (COREG) 25 MG tablet  2. pantoprazole (PROTONIX) 40 MG tablet  3. ramipril (ALTACE) 10 MG capsule  4.  5.  6.  ____ Fleet Enema (as directed)   __x__ Use CHG Soap as directed  ____ Use inhalers on the day of surgery  ____ Stop metformin 2 days prior to surgery    ____ Take 1/2 of usual insulin dose  the night before surgery and none on the morning of surgery.   _x___ Stop aspirin on as per Dr. Lillie Fragmin instructions 5 days before 01/04/17  _x___ Stop Anti-inflammatories on 01/02/17   ____ Stop supplements until after surgery.    ____ Bring C-Pap to the hospital.

## 2017-01-02 LAB — SURGICAL PCR SCREEN
MRSA, PCR: POSITIVE — AB
STAPHYLOCOCCUS AUREUS: POSITIVE — AB

## 2017-01-02 NOTE — Pre-Procedure Instructions (Signed)
CEFAZOLIN D/C AND VANCOMYCIN ORDERED BY DR Saralyn Pilar

## 2017-01-02 NOTE — Pre-Procedure Instructions (Signed)
Faxed labs with positive mrsa/staph to dr Sheron Nightingale

## 2017-01-09 ENCOUNTER — Encounter
Admission: RE | Disposition: A | Discharge status: Home / Self Care | Payer: Self-pay | Source: Ambulatory Visit | Attending: Cardiology

## 2017-01-09 ENCOUNTER — Encounter: Payer: Self-pay | Admitting: *Deleted

## 2017-01-09 ENCOUNTER — Ambulatory Visit: Payer: PPO | Admitting: Certified Registered Nurse Anesthetist

## 2017-01-09 ENCOUNTER — Ambulatory Visit
Admission: RE | Admit: 2017-01-09 | Discharge: 2017-01-09 | Disposition: A | Discharge status: Home / Self Care | Payer: PPO | Source: Ambulatory Visit | Attending: Cardiology | Admitting: Cardiology

## 2017-01-09 DIAGNOSIS — Z7982 Long term (current) use of aspirin: Secondary | ICD-10-CM | POA: Diagnosis not present

## 2017-01-09 DIAGNOSIS — E785 Hyperlipidemia, unspecified: Secondary | ICD-10-CM | POA: Insufficient documentation

## 2017-01-09 DIAGNOSIS — F329 Major depressive disorder, single episode, unspecified: Secondary | ICD-10-CM | POA: Insufficient documentation

## 2017-01-09 DIAGNOSIS — Z79899 Other long term (current) drug therapy: Secondary | ICD-10-CM | POA: Diagnosis not present

## 2017-01-09 DIAGNOSIS — K219 Gastro-esophageal reflux disease without esophagitis: Secondary | ICD-10-CM | POA: Diagnosis not present

## 2017-01-09 DIAGNOSIS — Z880 Allergy status to penicillin: Secondary | ICD-10-CM | POA: Insufficient documentation

## 2017-01-09 DIAGNOSIS — Z4502 Encounter for adjustment and management of automatic implantable cardiac defibrillator: Secondary | ICD-10-CM | POA: Diagnosis not present

## 2017-01-09 DIAGNOSIS — M1990 Unspecified osteoarthritis, unspecified site: Secondary | ICD-10-CM | POA: Insufficient documentation

## 2017-01-09 DIAGNOSIS — Z45010 Encounter for checking and testing of cardiac pacemaker pulse generator [battery]: Secondary | ICD-10-CM | POA: Diagnosis not present

## 2017-01-09 DIAGNOSIS — I5022 Chronic systolic (congestive) heart failure: Secondary | ICD-10-CM | POA: Diagnosis not present

## 2017-01-09 DIAGNOSIS — Z8249 Family history of ischemic heart disease and other diseases of the circulatory system: Secondary | ICD-10-CM | POA: Insufficient documentation

## 2017-01-09 DIAGNOSIS — I429 Cardiomyopathy, unspecified: Secondary | ICD-10-CM | POA: Diagnosis not present

## 2017-01-09 DIAGNOSIS — I495 Sick sinus syndrome: Secondary | ICD-10-CM | POA: Insufficient documentation

## 2017-01-09 DIAGNOSIS — I509 Heart failure, unspecified: Secondary | ICD-10-CM | POA: Diagnosis not present

## 2017-01-09 DIAGNOSIS — Z791 Long term (current) use of non-steroidal anti-inflammatories (NSAID): Secondary | ICD-10-CM | POA: Diagnosis not present

## 2017-01-09 DIAGNOSIS — I110 Hypertensive heart disease with heart failure: Secondary | ICD-10-CM | POA: Diagnosis not present

## 2017-01-09 DIAGNOSIS — I10 Essential (primary) hypertension: Secondary | ICD-10-CM | POA: Diagnosis not present

## 2017-01-09 HISTORY — PX: IMPLANTABLE CARDIOVERTER DEFIBRILLATOR (ICD) GENERATOR CHANGE: SHX5469

## 2017-01-09 SURGERY — ICD GENERATOR CHANGE
Anesthesia: General | Laterality: Left

## 2017-01-09 MED ORDER — LIDOCAINE 1 % OPTIME INJ - NO CHARGE
INTRAMUSCULAR | Status: DC | PRN
  Administered 2017-01-09: 27 mL

## 2017-01-09 MED ORDER — PROPOFOL 10 MG/ML IV BOLUS
INTRAVENOUS | Status: DC | PRN
  Administered 2017-01-09 (×2): 20 mg via INTRAVENOUS
  Administered 2017-01-09: 10 mg via INTRAVENOUS

## 2017-01-09 MED ORDER — LIDOCAINE HCL (PF) 2 % IJ SOLN
INTRAMUSCULAR | Status: AC
  Filled 2017-01-09: qty 2

## 2017-01-09 MED ORDER — VANCOMYCIN HCL IN DEXTROSE 1-5 GM/200ML-% IV SOLN
1000.0000 mg | Freq: Once | INTRAVENOUS | Status: AC
  Administered 2017-01-09: 1000 mg via INTRAVENOUS

## 2017-01-09 MED ORDER — CLARITHROMYCIN 250 MG PO TABS: 250.0000 mg | ORAL_TABLET | Freq: Two times a day (BID) | ORAL | 0 refills | Status: DC

## 2017-01-09 MED ORDER — FENTANYL CITRATE (PF) 100 MCG/2ML IJ SOLN: 25.0000 ug | INTRAMUSCULAR | Status: DC | PRN

## 2017-01-09 MED ORDER — HYDRALAZINE HCL 20 MG/ML IJ SOLN
5.0000 mg | Freq: Once | INTRAMUSCULAR | Status: AC
  Administered 2017-01-09: 5 mg via INTRAVENOUS

## 2017-01-09 MED ORDER — EPHEDRINE SULFATE 50 MG/ML IJ SOLN
INTRAMUSCULAR | Status: DC | PRN
  Administered 2017-01-09 (×2): 5 mg via INTRAVENOUS
  Administered 2017-01-09 (×2): 10 mg via INTRAVENOUS

## 2017-01-09 MED ORDER — VANCOMYCIN HCL IN DEXTROSE 1-5 GM/200ML-% IV SOLN
INTRAVENOUS | Status: AC
  Filled 2017-01-09: qty 200

## 2017-01-09 MED ORDER — CLARITHROMYCIN 250 MG PO TABS: 250.0000 mg | ORAL_TABLET | Freq: Two times a day (BID) | ORAL | 1 refills | Status: DC

## 2017-01-09 MED ORDER — LABETALOL HCL 5 MG/ML IV SOLN
10.0000 mg | Freq: Once | INTRAVENOUS | Status: AC
  Administered 2017-01-09: 10 mg via INTRAVENOUS

## 2017-01-09 MED ORDER — SODIUM CHLORIDE 0.9 % IR SOLN
Freq: Once | Status: AC
  Administered 2017-01-09: 2 mL
  Filled 2017-01-09: qty 2

## 2017-01-09 MED ORDER — LABETALOL HCL 5 MG/ML IV SOLN
INTRAVENOUS | Status: AC
  Filled 2017-01-09: qty 4

## 2017-01-09 MED ORDER — LACTATED RINGERS IV SOLN
INTRAVENOUS | Status: DC
  Administered 2017-01-09: 12:00:00 via INTRAVENOUS

## 2017-01-09 MED ORDER — GENTAMICIN SULFATE 40 MG/ML IJ SOLN
INTRAMUSCULAR | Status: AC
  Filled 2017-01-09: qty 2

## 2017-01-09 MED ORDER — LIDOCAINE HCL (CARDIAC) 20 MG/ML IV SOLN
INTRAVENOUS | Status: DC | PRN
  Administered 2017-01-09: 80 mg via INTRAVENOUS

## 2017-01-09 MED ORDER — PROPOFOL 500 MG/50ML IV EMUL
INTRAVENOUS | Status: DC | PRN
  Administered 2017-01-09: 75 ug/kg/min via INTRAVENOUS

## 2017-01-09 MED ORDER — HYDRALAZINE HCL 20 MG/ML IJ SOLN
INTRAMUSCULAR | Status: AC
  Filled 2017-01-09: qty 1

## 2017-01-09 MED ORDER — PROPOFOL 10 MG/ML IV BOLUS
INTRAVENOUS | Status: AC
  Filled 2017-01-09: qty 20

## 2017-01-09 MED ORDER — EPHEDRINE 5 MG/ML INJ
INTRAVENOUS | Status: AC
  Filled 2017-01-09: qty 10

## 2017-01-09 MED ORDER — PROPOFOL 10 MG/ML IV BOLUS
INTRAVENOUS | Status: AC
  Filled 2017-01-09: qty 40

## 2017-01-09 SURGICAL SUPPLY — 47 items
BAG DECANTER FOR FLEXI CONT (MISCELLANEOUS) ×3 IMPLANT
BLADE PHOTON ILLUMINATED (MISCELLANEOUS) ×3 IMPLANT
BLADE SURG SZ10 CARB STEEL (BLADE) ×3 IMPLANT
CABLE SURG 12 DISP A/V CHANNEL (MISCELLANEOUS) ×2 IMPLANT
CANISTER SUCT 1200ML W/VALVE (MISCELLANEOUS) ×3 IMPLANT
CHLORAPREP W/TINT 26ML (MISCELLANEOUS) ×3 IMPLANT
CLOSURE WOUND 1/2 X4 (GAUZE/BANDAGES/DRESSINGS) ×1
COVER LIGHT HANDLE STERIS (MISCELLANEOUS) ×6 IMPLANT
DRAPE C-ARM XRAY 36X54 (DRAPES) ×3 IMPLANT
DRAPE INCISE IOBAN 66X45 STRL (DRAPES) ×3 IMPLANT
DRAPE LAPAROTOMY 77X122 PED (DRAPES) ×3 IMPLANT
DRSG TEGADERM 4X4.75 (GAUZE/BANDAGES/DRESSINGS) ×3 IMPLANT
DRSG TEGADERM 6X8 (GAUZE/BANDAGES/DRESSINGS) ×3 IMPLANT
ELECT REM PT RETURN 9FT ADLT (ELECTROSURGICAL) ×3
ELECTRODE REM PT RTRN 9FT ADLT (ELECTROSURGICAL) ×1 IMPLANT
GLOVE BIO SURGEON STRL SZ8 (GLOVE) ×3 IMPLANT
GOWN STRL REUS W/ TWL LRG LVL3 (GOWN DISPOSABLE) ×1 IMPLANT
GOWN STRL REUS W/ TWL XL LVL3 (GOWN DISPOSABLE) ×1 IMPLANT
GOWN STRL REUS W/TWL LRG LVL3 (GOWN DISPOSABLE) ×3
GOWN STRL REUS W/TWL XL LVL3 (GOWN DISPOSABLE) ×3
GRADUATE 1200CC STRL 31836 (MISCELLANEOUS) ×3 IMPLANT
ICD CLARIA MRI DTMA1D1 (ICD Generator) ×2 IMPLANT
IV NS 1000ML (IV SOLUTION) ×3
IV NS 1000ML BAXH (IV SOLUTION) ×1 IMPLANT
KIT RM TURNOVER STRD PROC AR (KITS) ×3 IMPLANT
NDL FILTER BLUNT 18X1 1/2 (NEEDLE) ×1 IMPLANT
NDL HYPO 25X1 1.5 SAFETY (NEEDLE) ×1 IMPLANT
NDL SPNL 22GX3.5 QUINCKE BK (NEEDLE) ×1 IMPLANT
NEEDLE FILTER BLUNT 18X 1/2SAF (NEEDLE) ×2
NEEDLE FILTER BLUNT 18X1 1/2 (NEEDLE) ×1 IMPLANT
NEEDLE HYPO 25X1 1.5 SAFETY (NEEDLE) ×3 IMPLANT
NEEDLE SPNL 22GX3.5 QUINCKE BK (NEEDLE) ×3 IMPLANT
NS IRRIG 500ML POUR BTL (IV SOLUTION) ×3 IMPLANT
PACK BASIN MINOR ARMC (MISCELLANEOUS) ×3 IMPLANT
PACK PACE INSERTION (MISCELLANEOUS) ×3 IMPLANT
PAD STATPAD (MISCELLANEOUS) ×3 IMPLANT
STRAP SAFETY BODY (MISCELLANEOUS) ×3 IMPLANT
STRIP CLOSURE SKIN 1/2X4 (GAUZE/BANDAGES/DRESSINGS) ×2 IMPLANT
SUT SILK 2 0 SH (SUTURE) ×3 IMPLANT
SUT VIC AB 2-0 CT1 27 (SUTURE) ×3
SUT VIC AB 2-0 CT1 TAPERPNT 27 (SUTURE) ×1 IMPLANT
SUT VIC AB 2-0 CT2 27 (SUTURE) ×3 IMPLANT
SUT VIC AB 3-0 PS2 18 (SUTURE) ×3 IMPLANT
SUT VIC AB 4-0 PS2 18 (SUTURE) ×3 IMPLANT
SYR BULB IRRIG 60ML STRL (SYRINGE) ×3 IMPLANT
SYR CONTROL 10ML (SYRINGE) ×3 IMPLANT
SYRINGE 10CC LL (SYRINGE) ×3 IMPLANT

## 2017-01-09 NOTE — Progress Notes (Signed)
Blood pressure 163/89

## 2017-01-09 NOTE — H&P (Signed)
<6>29299-5<7>Encounter Details<6>46240-8<7>Social History<6>29762-2<7>Last Filed Vital Signs<6>8716-3<7>Progress Notes<6>10164-2<7>Plan of Treatment<6>18776-5<7>Visit Diagnoses<6>51848-0<7>/"> Jump to Section ? Document InformationEncounter DetailsLast Filed Vital SignsPatient DemographicsPlan of TreatmentProgress NotesReason for VisitSocial HistoryVisit Diagnoses Marva Panda Encounter Summary, generated on Mar. 28, 2018 Printout Information  Document Contents Office Visit Document Received Date Mar. 28, 2018 [place]   Patient Demographics - 77 y.o. Female, born [birth year]   Patient Address Communication Language Race / Ethnicity  [address] [place], [place] [address] [phone] [facility]) [phone] (Work) English (Preferred) Dema Severin / Not Hispanic or Latino  Reason for Visit    Reason Comments  Follow-up pacer ERI  Pacer-ICD     Encounter Details    Date Type Department Care Team Description  12/25/2016 Office Visit [facility]  [place],  [address]  [phone]  Flossie Dibble, [place]  [facility]  [place],  [address]  [phone]  [phone] (Fax)  Sinoatrial node dysfunction (Primary Dx);  Other fatigue   Social History - as of this encounter   Tobacco Use Types Packs/Day Years Used Date  Never Smoker      Smokeless Tobacco: Never Used      Alcohol Use Drinks/Week oz/Week Comments  No 0 Standard drinks or equivalent  0.0     Sex Assigned at Birth Date Recorded  Not on file    Last Filed Vital Signs - in this encounter   Vital Sign Reading Time Taken  Blood Pressure 146/80 12/25/2016 12:40 PM EDT  Pulse 68 12/25/2016 12:40 PM EDT  Temperature - -  Respiratory Rate 14 12/25/2016 12:40 PM EDT  Oxygen Saturation - -  Inhaled Oxygen Concentration - -  Weight 59.4 kg (131 lb) 12/25/2016 12:40  PM EDT  Height 147.3 cm (4\' 10" ) 12/25/2016 12:40 PM EDT  Body Mass Index 27.38 12/25/2016 12:40 PM EDT   Progress Notes - in this encounter   Flossie Dibble, MD - 12/25/2016 12:15 PM EDT Formatting of this note may be different from the original. Established Patient Visit   Chief Complaint: Chief Complaint  Patient presents with  . Follow-up  pacer ERI  . Pacer-ICD  Date of Service: 12/25/2016 Date of Birth: Aug 14, 1941 PCP: Idelle Crouch, MD, MD  History of Present Illness: Michele Montgomery is a 76 y.o.female patient  Fatigue/Tiredness The patient has has complaints of acute onset of fatigue and or tiredness worsening with increased severity and frequency over the last 4 days. They claim it is moderate, not relieved by rest but does not interfere with activities of daily living. It appears that the triggers are any exercise and moderate activity and can have other related symptoms of dyspnea, fatigue and irregular heart beat. They have risk factors that include age. The source of symptoms and the investigation of the cause should include pacer ERI, decreased exercise tolerance, rhythm disturbances and age  Pacemaker Interrogation The patient has had a pacemaker interrogation which has shown that the pacemaker battery has reached end of life. We have had further discussion of battery change out with all of the risks and benefits. Otherwise the patient has not had any significant side effects or symptoms of the pacemaker wires.  Past Medical and Surgical History  Past Medical History Past Medical History:  Diagnosis Date  . Allergic state  . Anemia, unspecified  . AR (allergic rhinitis)  . Colitis, unspecified  C-difficile colitis  . Dilated cardiomyopathy (CMS-HCC)  . Diverticulosis 08/01/2015  .  Enlarged heart  . Fibroid, uterine  . Fundic gland polyps of stomach, benign 08/01/2015  . GERD (gastroesophageal reflux disease)  . Hyperlipidemia, unspecified  . Hypertension    . Internal hemorrhoids 08/01/2015  . Osteoarthritis  . Osteoporosis, post-menopausal  . Ovarian cyst  . Pacemaker  . Recurrent sinusitis, unspecified  . Thyromegaly  . Uterine fibroid  . Venous stasis  w/ peripheral edema   Past Surgical History She has a past surgical history that includes insertion epicardial pacemaker thoracotomy; defibrillator; Dilation and curettage, diagnostic / therapeutic; Colonoscopy (10/22/2003, 08/28/2013); egd (10/01/2013); Colonoscopy (08/01/2015); egd (08/01/2015); Colonoscopy (08/01/2015); Upper gastrointestinal endoscopy (08/01/2015); and Cataract extraction (Left, 03/05/2016).   Medications and Allergies  Current Medications  Current Outpatient Prescriptions on File Prior to Visit  Medication Sig Dispense Refill  . aspirin (ADULT LOW DOSE ASPIRIN) 81 MG EC tablet 1 tab by mouth daily  . carvedilol (COREG) 25 MG tablet TAKE ONE TABLET TWICE DAILY WITH MEALS 180 tablet 1  . cetirizine (ZYRTEC) 10 MG tablet 1 tab by mouth daily  . cholecalciferol, vitamin D3, (VITAMIN D3) 5,000 unit Tab Take by mouth.  . citalopram (CELEXA) 20 MG tablet Take 1 tablet (20 mg total) by mouth once daily. 30 tablet 11  . cyclobenzaprine (FLEXERIL) 5 MG tablet Take 1 tablet (5 mg total) by mouth 2 (two) times daily. 60 tablet 3  . ibuprofen (ADVIL,MOTRIN) 600 MG tablet Take 1 tablet (600 mg total) by mouth 2 (two) times daily. 60 tablet 3  . LORazepam (ATIVAN) 0.5 MG tablet Take 1 tablet (0.5 mg total) by mouth 3 (three) times daily as needed for Anxiety. 100 tablet 3  . pantoprazole (PROTONIX) 40 MG DR tablet TAKE ONE TABLET EVERY DAY 90 tablet 3  . ramipril (ALTACE) 10 MG capsule TAKE 1 CAPSULE EVERY DAY 90 capsule 1  . simvastatin (ZOCOR) 40 MG tablet TAKE ONE TABLET AT BEDTIME 90 tablet 1   No current facility-administered medications on file prior to visit.   Allergies: Penicillin g; Prednisone; Ciprofloxacin; and Metronidazole  Social and Family History  Social  History reports that she has never smoked. She has never used smokeless tobacco. She reports that she does not drink alcohol or use drugs.  Family History Family History  Problem Relation Age of Onset  . Heart disease Mother  . High blood pressure (Hypertension) Mother  . Heart disease Father  . Pancreatic cancer Father  . High blood pressure (Hypertension) Father   Review of Systems   Review of Systems  Positive for fatigue sob Negative for weight gain weight loss, weakness, vision change, hearing loss, cough, congestion, PND, orthopnea, heartburn, nausea, diaphoresis, vomiting, diarrhea, bloody stool, melena, stomach pain, extremity pain, leg weakness, leg cramping, leg blood clots, headache, blackouts, nosebleed, trouble swallowing, mouth pain, urinary frequency, urination at night, muscle weakness, skin lesions, skin rashes, tingling ,ulcers, numbness, anxiety, and/or depression Physical Examination   Vitals:BP 146/80  Pulse 68  Resp 14  Ht 147.3 cm (4\' 10" )  Wt 59.4 kg (131 lb)  BMI 27.38 kg/m  Ht:147.3 cm (4\' 10" ) Wt:59.4 kg (131 lb) TFT:DDUK surface area is 1.56 meters squared. Body mass index is 27.38 kg/m. Appearance: well appearing in no acute distress HEENT: Pupils equally reactive to light and accomodation, no xanthalasma  Neck: Supple, no apparent thyromegaly, masses, or lymphadenopathy  Lungs: normal respiratory effort; no crackles, no rhonchi, no wheezes Heart: Regular rate and rhythm. Normal S1 S2 No gallops, murmur, no rub, PMI is normal size and placement.  carotid upstroke normal without bruit. Jugular venous pressure is normal Abdomen: soft, nontender, not distended with normal bowel sounds. No apparent hepatosplenomegally. Abdominal aorta is normal size without bruit Extremities: no edema, no ulcers, no clubbing, no cyanosis Peripheral Pulses: 2+ in upper extremities, 2+ femoral pulses bilaterally, 2+lower extremity  Musculoskeletal; Normal muscle tone without  kyphosis Neurological: Oriented and Alert, Cranial nerves intact  Assessment   76 y.o. female with  Encounter Diagnoses  Name Primary?  . Other fatigue  . Sinoatrial node dysfunction Yes   Plan   -the patient will have a consultation and or surgical treatment for recent pacemaker interrogation showing the battery to be end of life. The patient will need a pacemaker battery change out. Patient understands all risks and benefits of battery change out. This includes the possibility of death, stroke, heart attack, infection, bleeding, blood clot, and or side effects of medication management for anesthesia and agrees to proceed  No orders of the defined types were placed in this encounter.  Return in about 4 weeks (around 01/22/2017).  Flossie Dibble, MD      Plan of Treatment - as of this encounter   Upcoming Encounters Upcoming Encounters  Date Type Specialty Care Team Description  02/01/2017 Ancillary Orders Lab Idelle Crouch, MD  [address]  [facility] [place], [place] [address]  [phone]  [phone] (Fax)    02/06/2017 Office Visit Internal Medicine Idelle Crouch, MD  [address]  [facility] [place], [place] [address]  [phone]  [phone] (Fax)    02/07/2017 Procedure visit Cardiology Flossie Dibble, MD  [address]  [facility]  [place], [place] [address]  [phone]  [phone] ([phone]    02/07/2017 Office Visit Cardiology Flossie Dibble, MD  [address]  [facility]  [place], [place] [address]  [phone]  [phone] (Fax)     Visit Diagnoses    Diagnosis  Sinoatrial node dysfunction - Primary  Other fatigue   Images Document Information   Primary Care Provider Idelle Crouch MD (Jan. 04, 2013 - Present) [phone] (Work) [phone] (Fax) [address] [facility]  [place], [place] [address]  Document Coverage Dates Mar. 13, 2018  [place], [place] [address]   Encounter Providers Flossie Dibble MD (Attending) [phone] (Work) [phone] (Fax) [facility] [place], [place] [address]   Encounter Date Mar. 13, 2018   Show All Sections

## 2017-01-09 NOTE — Anesthesia Post-op Follow-up Note (Cosign Needed)
Anesthesia QCDR form completed.        

## 2017-01-09 NOTE — Progress Notes (Signed)
Labetalol given for blood pressure of 188/89

## 2017-01-09 NOTE — Op Note (Signed)
[facility] Cardiology   01/09/2017                     1:21 PM  PATIENT:  Michele Montgomery    PRE-OPERATIVE DIAGNOSIS:  BIV ICD ERI  POST-OPERATIVE DIAGNOSIS:  Same  PROCEDURE:  permanent biventricular pacemaker and implantable cardioverter defibrillator  SURGEON:  Isaias Cowman, MD    ANESTHESIA:     PREOPERATIVE INDICATIONS:  CYLEE DATTILO is a  76 y.o. female with a diagnosis of BIV ICD ERI who failed conservative measures and elected for surgical management.    The risks benefits and alternatives were discussed with the patient preoperatively including but not limited to the risks of infection, bleeding, cardiopulmonary complications, the need for revision surgery, among others, and the patient was willing to proceed.   OPERATIVE PROCEDURE: The patient was brought to the operating room in fasting state. The left pectoral region was prepped and draped in usual sterile manner. Anesthesia was obtained 1% lidocaine locally. A 6 cm incision was performed the left pectoral region. The old ICD generator was retrieved electrocautery and blunt dissection. The leads were disconnected and connected to a new BiV ICD, Medtronic Claria MRI CRT-D [id number]). The pacemaker pocket was irrigated with gentamicin solution. The new ICD generator was positioned into the pocket and the pocket was closed with 2-0 and 4-0 Vicryl, respectively. Steri-Strips and pressure dressing were applied.   ICD Criteria  Current LVEF:55%. Within 12 months prior to implant: No   Heart failure history: Yes, Class II  Cardiomyopathy history: Yes, Non-Ischemic Cardiomyopathy.  Atrial Fibrillation/Atrial Flutter: No.  Ventricular tachycardia history: No.  Cardiac arrest history: No.  History of syndromes with risk of sudden death: No.  Previous ICD: Yes, Reason for ICD:  Primary prevention.  Current ICD indication:  Primary  PPM indication: Yes. Pacing type: Both. Greater than 40% RV pacing requirement anticipated. Indication: Sick Sinus Syndrome   Class I or II Bradycardia indication present: Yes  Beta Blocker therapy for 3 or more months: Yes, prescribed.   Ace Inhibitor/ARB therapy for 3 or more months: Yes, prescribed.

## 2017-01-09 NOTE — Transfer of Care (Signed)
Immediate Anesthesia Transfer of Care Note  Patient: Michele Montgomery  Procedure(s) Performed: Procedure(s): permanent biventricular pacemaker and implantable cardioverter defibrillator (Left)  Patient Location: PACU  Anesthesia Type:General  Level of Consciousness: awake, alert  and oriented  Airway & Oxygen Therapy: Patient Spontanous Breathing and Patient connected to face mask oxygen  Post-op Assessment: Report given to RN and Post -op Vital signs reviewed and stable  Post vital signs: Reviewed and stable  Last Vitals:  Vitals:   01/09/17 1108  BP: (!) 190/98  Pulse: 68  Resp: 18  Temp: 37 C    Last Pain:  Vitals:   01/09/17 1108  TempSrc: Tympanic         Complications: No apparent anesthesia complications

## 2017-01-09 NOTE — Progress Notes (Signed)
Blood pressure 201/85  Dr Amie Critchley gave order for hydralazine 5mg  which was  Given

## 2017-01-09 NOTE — Anesthesia Procedure Notes (Signed)
Performed by: Demetrius Charity Pre-anesthesia Checklist: Patient identified, Suction available, Emergency Drugs available, Timeout performed and Patient being monitored Patient Re-evaluated:Patient Re-evaluated prior to inductionOxygen Delivery Method: Simple face mask Intubation Type: IV induction

## 2017-01-09 NOTE — Progress Notes (Signed)
Blood pressure remains 191/81 another hydralazine 5mg  given

## 2017-01-09 NOTE — Anesthesia Preprocedure Evaluation (Addendum)
Anesthesia Evaluation  Patient identified by MRN, date of birth, ID band Patient awake    Reviewed: Allergy & Precautions, H&P , NPO status , Patient's Chart, lab work & pertinent test results  History of Anesthesia Complications (+) PONV and history of anesthetic complications  Airway Mallampati: III  TM Distance: <3 FB Neck ROM: limited    Dental  (+) Poor Dentition, Chipped, Caps   Pulmonary neg pulmonary ROS, neg shortness of breath,    Pulmonary exam normal breath sounds clear to auscultation       Cardiovascular Exercise Tolerance: Good hypertension, (-) angina+CHF  Normal cardiovascular exam+ dysrhythmias + pacemaker + Cardiac Defibrillator  Rhythm:regular Rate:Normal     Neuro/Psych PSYCHIATRIC DISORDERS Depression negative neurological ROS     GI/Hepatic negative GI ROS, Neg liver ROS, GERD  Controlled,  Endo/Other  negative endocrine ROS  Renal/GU negative Renal ROS  negative genitourinary   Musculoskeletal  (+) Arthritis ,   Abdominal   Peds  Hematology negative hematology ROS (+)   Anesthesia Other Findings Past Medical History: No date: AICD (automatic cardioverter/defibrillator) pr*     Comment: 2011 No date: Anemia No date: Arthritis     Comment: osteo No date: Colitis No date: Depression No date: Dilated cardiomyopathy (HCC) No date: Diverticulitis No date: Dysrhythmia No date: Enlarged heart No date: Fracture     Comment: LEFT ANKLE/ WEARING BRACE No date: GERD (gastroesophageal reflux disease) No date: Hearing aid worn No date: Hyperlipidemia No date: Hypertension No date: Osteoporosis No date: Ovarian cyst No date: Spinal stenosis No date: Thyromegaly No date: Venous stasis  Past Surgical History: No date: CARDIAC DEFIBRILLATOR PLACEMENT No date: CARDIAC DEFIBRILLATOR PLACEMENT 03/05/2016: CATARACT EXTRACTION W/PHACO Left     Comment: Procedure: CATARACT EXTRACTION PHACO AND                INTRAOCULAR LENS PLACEMENT (IOC);  Surgeon:               Estill Cotta, MD;  Location: [facility] ORS;                Service: Ophthalmology;  Laterality: Left;  [place]               00:58 AP% 23.9 CDE 24.51 fluid pack lot #               [id number] 07/25/2016: CATARACT EXTRACTION W/PHACO Right     Comment: Procedure: CATARACT EXTRACTION PHACO AND               INTRAOCULAR LENS PLACEMENT (IOC);  Surgeon:               Estill Cotta, MD;  Location: [facility] ORS;                Service: Ophthalmology;  Laterality: Right;  [place]              1.05 AP% 20.4 CDE 21.59 fluid pack lot #               [id number] 08/01/2015: COLONOSCOPY WITH PROPOFOL N/A     Comment: Procedure: COLONOSCOPY WITH PROPOFOL;                Surgeon: Josefine Class, MD;  Location[facility]               [facility] ENDOSCOPY;  Service: Endoscopy;                Laterality: N/A; 08/01/2015: ESOPHAGOGASTRODUODENOSCOPY (EGD) WITH PROPOFOL N/A  Comment: Procedure: ESOPHAGOGASTRODUODENOSCOPY (EGD)               WITH PROPOFOL;  Surgeon: Josefine Class,               MD;  Location: [facility] ENDOSCOPY;  Service:               Endoscopy;  Laterality: N/A; No date: PACEMAKER PLACEMENT     Reproductive/Obstetrics negative OB ROS                            Anesthesia Physical Anesthesia Plan  ASA: III  Anesthesia Plan: General   Post-op Pain Management:    Induction:   Airway Management Planned:   Additional Equipment:   Intra-op Plan:   Post-operative Plan:   Informed Consent: I have reviewed the patients History and Physical, chart, labs and discussed the procedure including the risks, benefits and alternatives for the proposed anesthesia with the patient or authorized representative who has indicated his/her understanding and acceptance.   Dental Advisory Given  Plan Discussed with: Anesthesiologist, CRNA and Surgeon  Anesthesia Plan Comments:         Anesthesia Quick  Evaluation

## 2017-01-09 NOTE — Anesthesia Postprocedure Evaluation (Signed)
Anesthesia Post Note  Patient: Michele Montgomery  Procedure(s) Performed: Procedure(s) (LRB): permanent biventricular pacemaker and implantable cardioverter defibrillator (Left)  Patient location during evaluation: PACU Anesthesia Type: General Level of consciousness: awake and alert Pain management: pain level controlled Vital Signs Assessment: post-procedure vital signs reviewed and stable Respiratory status: spontaneous breathing, nonlabored ventilation, respiratory function stable and patient connected to nasal cannula oxygen Cardiovascular status: blood pressure returned to baseline and stable Postop Assessment: no signs of nausea or vomiting Anesthetic complications: no     Last Vitals:  Vitals:   01/09/17 1448 01/09/17 1456  BP: (!) 163/89 (!) 161/78  Pulse: 77 78  Resp: 20 16  Temp:  36.4 C    Last Pain:  Vitals:   01/09/17 1456  TempSrc: Temporal                 Precious Haws Krystol Rocco

## 2017-01-10 ENCOUNTER — Encounter: Payer: Self-pay | Admitting: Cardiology

## 2017-01-17 DIAGNOSIS — I10 Essential (primary) hypertension: Secondary | ICD-10-CM | POA: Diagnosis not present

## 2017-01-17 DIAGNOSIS — I420 Dilated cardiomyopathy: Secondary | ICD-10-CM | POA: Diagnosis not present

## 2017-01-17 DIAGNOSIS — E782 Mixed hyperlipidemia: Secondary | ICD-10-CM | POA: Diagnosis not present

## 2017-01-17 DIAGNOSIS — I495 Sick sinus syndrome: Secondary | ICD-10-CM | POA: Diagnosis not present

## 2017-01-17 DIAGNOSIS — Z9889 Other specified postprocedural states: Secondary | ICD-10-CM | POA: Diagnosis not present

## 2017-01-23 IMAGING — MG MM DIGITAL SCREENING BILATERAL
5 series · 5 of 5 positions shown · non-contrast
Comparison: Previous exam(s).

CLINICAL DATA: Screening.

EXAM:
DIGITAL SCREENING BILATERAL MAMMOGRAM WITH CAD

[R CC]
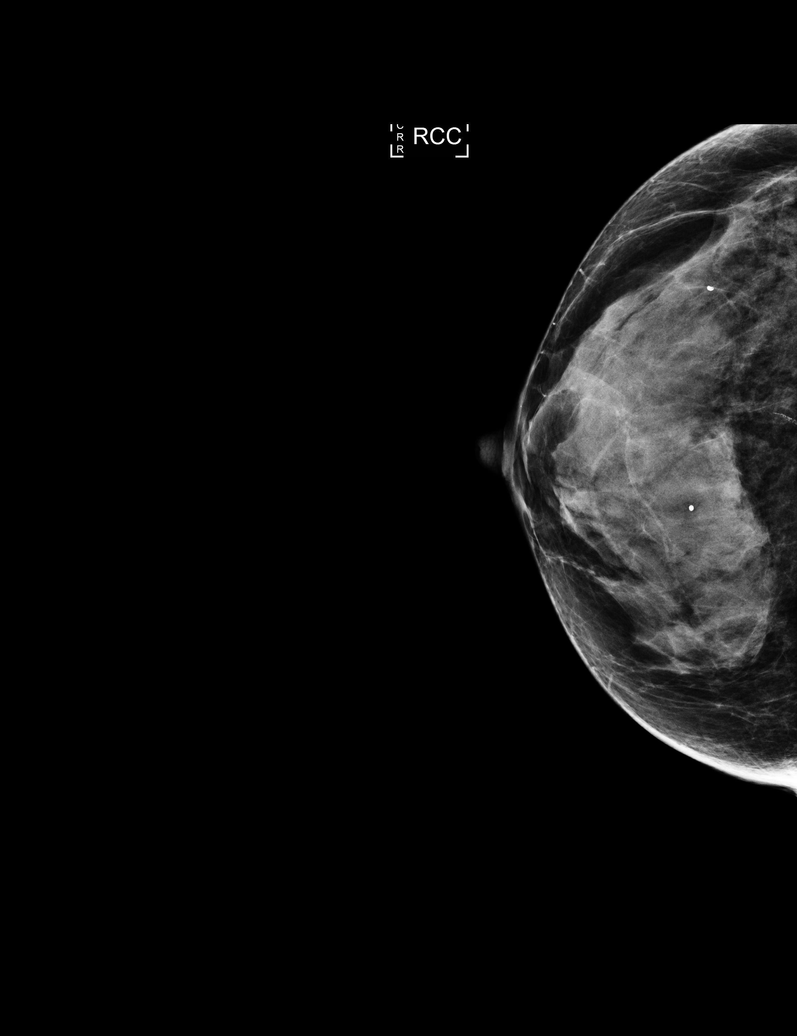

[L MLO (1 of 2)]
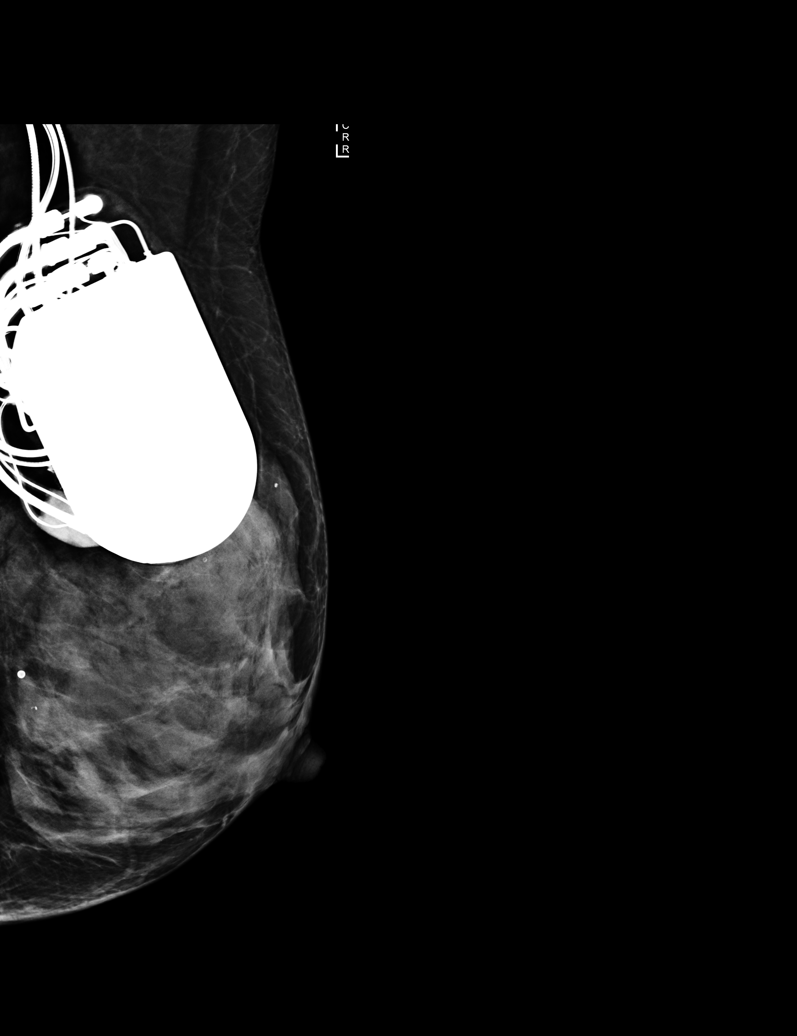

[L MLO (2 of 2)]
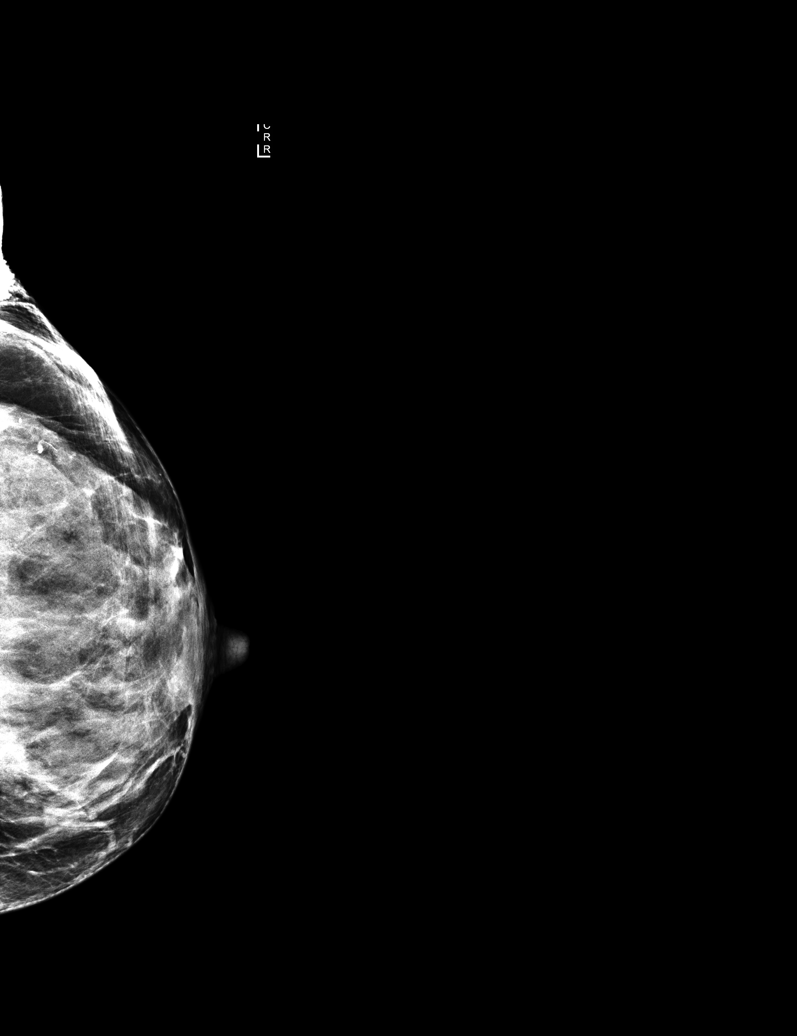

[L CC]
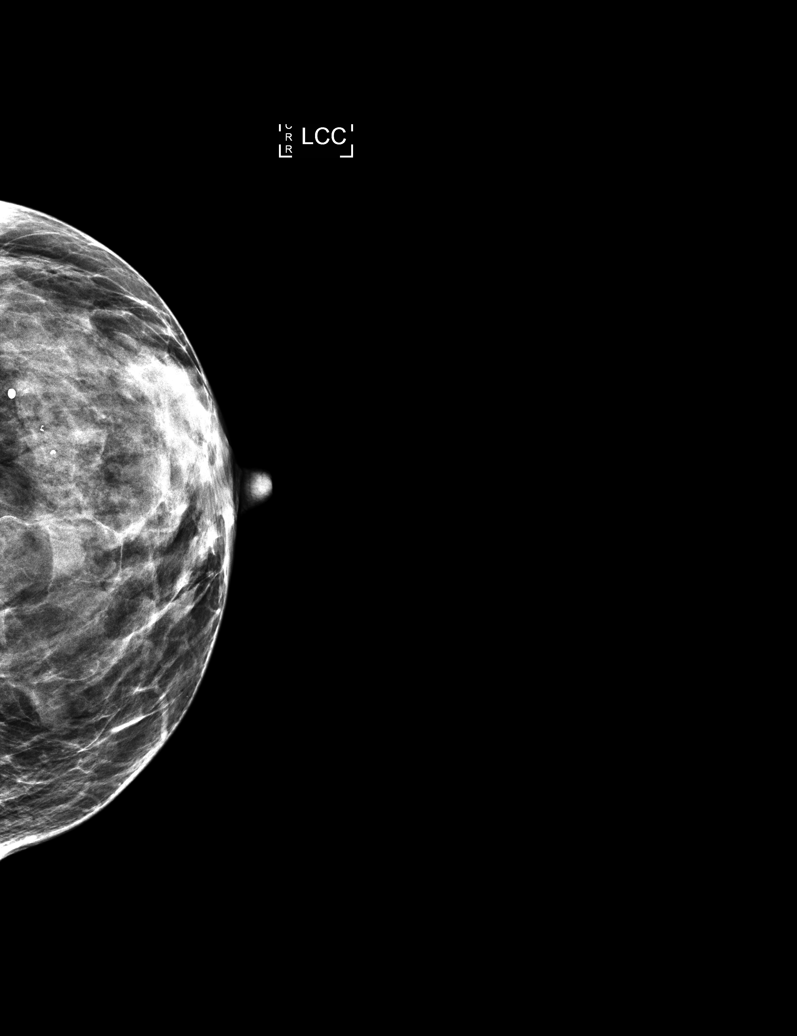

[R MLO]
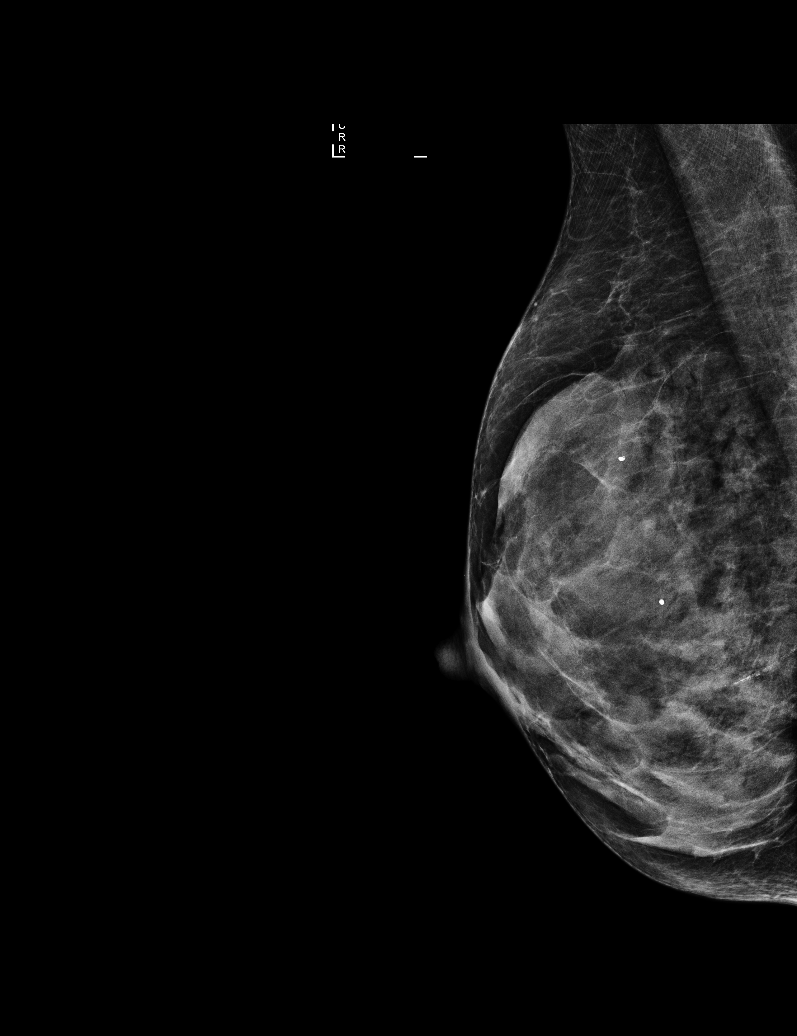

[5 of 5 positions shown; findings below may reference images not displayed]

ACR Breast Density Category d: The breast tissue is extremely dense,
which lowers the sensitivity of mammography.
FINDINGS: There are no findings suspicious for malignancy. Images were
processed with CAD.
IMPRESSION: No mammographic evidence of malignancy. A result letter of this
screening mammogram will be mailed directly to the patient.

RECOMMENDATION:
Screening mammogram in one year. (Code:BD-[id number])

BI-RADS CATEGORY  1: Negative.

## 2017-02-01 DIAGNOSIS — I10 Essential (primary) hypertension: Secondary | ICD-10-CM | POA: Diagnosis not present

## 2017-02-06 DIAGNOSIS — D649 Anemia, unspecified: Secondary | ICD-10-CM | POA: Diagnosis not present

## 2017-02-06 DIAGNOSIS — I10 Essential (primary) hypertension: Secondary | ICD-10-CM | POA: Diagnosis not present

## 2017-02-06 DIAGNOSIS — Z79899 Other long term (current) drug therapy: Secondary | ICD-10-CM | POA: Diagnosis not present

## 2017-02-06 DIAGNOSIS — I495 Sick sinus syndrome: Secondary | ICD-10-CM | POA: Diagnosis not present

## 2017-02-06 DIAGNOSIS — E010 Iodine-deficiency related diffuse (endemic) goiter: Secondary | ICD-10-CM | POA: Diagnosis not present

## 2017-02-06 DIAGNOSIS — E782 Mixed hyperlipidemia: Secondary | ICD-10-CM | POA: Diagnosis not present

## 2017-02-07 DIAGNOSIS — I420 Dilated cardiomyopathy: Secondary | ICD-10-CM | POA: Diagnosis not present

## 2017-02-08 DIAGNOSIS — H6123 Impacted cerumen, bilateral: Secondary | ICD-10-CM | POA: Diagnosis not present

## 2017-02-08 DIAGNOSIS — H903 Sensorineural hearing loss, bilateral: Secondary | ICD-10-CM | POA: Diagnosis not present

## 2017-02-24 ENCOUNTER — Encounter: Payer: Self-pay | Admitting: Emergency Medicine

## 2017-02-24 ENCOUNTER — Emergency Department
Admission: EM | Admit: 2017-02-24 | Discharge: 2017-02-24 | Disposition: A | Discharge status: Home / Self Care | Payer: PPO | Attending: Emergency Medicine | Admitting: Emergency Medicine

## 2017-02-24 ENCOUNTER — Emergency Department: Payer: PPO

## 2017-02-24 DIAGNOSIS — N10 Acute tubulo-interstitial nephritis: Secondary | ICD-10-CM | POA: Diagnosis not present

## 2017-02-24 DIAGNOSIS — R350 Frequency of micturition: Secondary | ICD-10-CM | POA: Diagnosis not present

## 2017-02-24 DIAGNOSIS — N12 Tubulo-interstitial nephritis, not specified as acute or chronic: Secondary | ICD-10-CM

## 2017-02-24 DIAGNOSIS — Z95810 Presence of automatic (implantable) cardiac defibrillator: Secondary | ICD-10-CM | POA: Insufficient documentation

## 2017-02-24 DIAGNOSIS — R309 Painful micturition, unspecified: Secondary | ICD-10-CM | POA: Diagnosis not present

## 2017-02-24 DIAGNOSIS — Z7982 Long term (current) use of aspirin: Secondary | ICD-10-CM | POA: Insufficient documentation

## 2017-02-24 DIAGNOSIS — I10 Essential (primary) hypertension: Secondary | ICD-10-CM | POA: Diagnosis not present

## 2017-02-24 DIAGNOSIS — Z79899 Other long term (current) drug therapy: Secondary | ICD-10-CM | POA: Diagnosis not present

## 2017-02-24 LAB — URINALYSIS, COMPLETE (UACMP) WITH MICROSCOPIC
Bacteria, UA: NONE SEEN
Bilirubin Urine: NEGATIVE
GLUCOSE, UA: NEGATIVE mg/dL
Ketones, ur: NEGATIVE mg/dL
Nitrite: NEGATIVE
PH: 6 (ref 5.0–8.0)
Protein, ur: NEGATIVE mg/dL
SPECIFIC GRAVITY, URINE: 1.002 — AB (ref 1.005–1.030)
Squamous Epithelial / LPF: NONE SEEN

## 2017-02-24 MED ORDER — LEVOFLOXACIN 750 MG PO TABS: 750.0000 mg | ORAL_TABLET | Freq: Every day | ORAL | 0 refills | Status: AC

## 2017-02-24 MED ORDER — LORAZEPAM 2 MG/ML IJ SOLN
0.5000 mg | Freq: Once | INTRAMUSCULAR | Status: AC
  Administered 2017-02-24: 0.5 mg via INTRAMUSCULAR
  Filled 2017-02-24: qty 1

## 2017-02-24 NOTE — ED Notes (Signed)
Pt provided with saltine crackers and ginger ale.  

## 2017-02-24 NOTE — ED Provider Notes (Signed)
[facility] Emergency Department Provider Note   ____________________________________________   I have reviewed the triage vital signs and the nursing notes.   HISTORY  Chief Complaint Urinary Tract Infection and Urinary Frequency    HPI Michele Montgomery is a 75 y.o. female presents with urinary frequency, burning, and mild discomfort during urination since 1700 today. Patient endorses elevated blood pressure and mild headache as the night progresses secondary to anxiety. Patient denies recurrent cystitis or pyelonephritis.   Patient denies fever, chills, headache, chest pain, chest tightness, shortness of breath, abdominal pain, nausea and vomiting.  Past Medical History:  Diagnosis Date  . AICD (automatic cardioverter/defibrillator) present    2011  . Anemia   . Arthritis    osteo  . Colitis   . Depression   . Dilated cardiomyopathy ([place])   . Diverticulitis   . Dysrhythmia   . Enlarged heart   . Fracture    LEFT ANKLE/ WEARING BRACE  . GERD (gastroesophageal reflux disease)   . Hearing aid worn   . Hyperlipidemia   . Hypertension   . Osteoporosis   . Ovarian cyst   . Spinal stenosis   . Thyromegaly   . Venous stasis     There are no active problems to display for this patient.   Past Surgical History:  Procedure Laterality Date  . CARDIAC DEFIBRILLATOR PLACEMENT    . CARDIAC DEFIBRILLATOR PLACEMENT    . CATARACT EXTRACTION W/PHACO Left 03/05/2016   Procedure: CATARACT EXTRACTION PHACO AND INTRAOCULAR LENS PLACEMENT (IOC);  Surgeon: Estill Cotta, MD;  Location: [facility] ORS;  Service: Ophthalmology;  Laterality: Left;  [place] 00:58 AP% 23.9 CDE 24.51 fluid pack lot # [id number]  . CATARACT EXTRACTION W/PHACO Right 07/25/2016   Procedure: CATARACT EXTRACTION PHACO AND INTRAOCULAR LENS PLACEMENT ([place]);  Surgeon: Estill Cotta, MD;  Location: [facility] ORS;  Service: Ophthalmology;  Laterality: Right;  [place] 1.05 AP% 20.4 CDE 21.59 fluid  pack lot # [id number]  . COLONOSCOPY WITH PROPOFOL N/A 08/01/2015   Procedure: COLONOSCOPY WITH PROPOFOL;  Surgeon: Josefine Class, MD;  Location: [facility] ENDOSCOPY;  Service: Endoscopy;  Laterality: N/A;  . ESOPHAGOGASTRODUODENOSCOPY (EGD) WITH PROPOFOL N/A 08/01/2015   Procedure: ESOPHAGOGASTRODUODENOSCOPY (EGD) WITH PROPOFOL;  Surgeon: Josefine Class, MD;  Location: [facility] ENDOSCOPY;  Service: Endoscopy;  Laterality: N/A;  . IMPLANTABLE CARDIOVERTER DEFIBRILLATOR (ICD) GENERATOR CHANGE Left 01/09/2017   Procedure: permanent biventricular pacemaker and implantable cardioverter defibrillator;  Surgeon: Isaias Cowman, MD;  Location: [facility] ORS;  Service: Cardiovascular;  Laterality: Left;  . PACEMAKER PLACEMENT      Prior to Admission medications   Medication Sig Start Date End Date Taking? Authorizing Provider  aspirin 81 MG tablet Take 1 tablet by mouth at bedtime.  11/25/09   [provider]  carvedilol (COREG) 25 MG tablet Take 25 mg by mouth 2 (two) times daily.  04/02/15   [provider]  cetirizine (ZYRTEC) 10 MG tablet Take 10 mg by mouth at bedtime.     [provider]  Cholecalciferol (VITAMIN D3) 5000 units CAPS Take 5,000 Units by mouth daily.    [provider]  citalopram (CELEXA) 20 MG tablet Take 20 mg by mouth at bedtime.     [provider]  clarithromycin (BIAXIN) 250 MG tablet Take 1 tablet (250 mg total) by mouth 2 (two) times daily. 01/09/17   Paraschos, Alexander, MD  clarithromycin (BIAXIN) 250 MG tablet Take 1 tablet (250 mg total) by mouth 2 (two) times daily. 01/09/17  Paraschos, Alexander, MD  cyclobenzaprine (FLEXERIL) 5 MG tablet Take 5 mg by mouth 2 (two) times daily as needed for muscle spasms. 06/19/16   [provider]  IBU 800 MG tablet Take 800 mg by mouth 2 (two) times daily as needed for pain. 12/13/16   [provider]  levofloxacin (LEVAQUIN) 750 MG tablet Take 1 tablet (750 mg total) by mouth  daily. 02/24/17 03/01/17  Aniyah Nobis M, PA-C  LORazepam (ATIVAN) 0.5 MG tablet Take 1 mg by mouth at bedtime.  06/09/15   [provider]  pantoprazole (PROTONIX) 40 MG tablet Take 1 tablet by mouth daily. 03/29/15   [provider]  Probiotic Product (ALIGN PO) Take 1 tablet by mouth at bedtime.    [provider]  ramipril (ALTACE) 10 MG capsule Take 10 mg by mouth daily.  03/29/15   [provider]  simvastatin (ZOCOR) 40 MG tablet Take 40 mg by mouth at bedtime.  04/03/15   [provider]  vitamin B-12 (CYANOCOBALAMIN) 1000 MCG tablet Take 1,000 mcg by mouth daily.    [provider]    Allergies Penicillins; Ciprofloxacin; Hydrochlorothiazide; Metronidazole; Other; and Prednisone  History reviewed. No pertinent family history.  Social History Social History  Substance Use Topics  . Smoking status: Never Smoker  . Smokeless tobacco: Never Used  . Alcohol use No    Review of Systems Constitutional:  Negative for fever/chills Eyes: No visual changes. ENT:  Negative for sore throat.  Cardiovascular: Denies chest pain. Respiratory: Denies shortness of breath. Gastrointestinal: No abdominal pain.  No nausea, vomiting, diarrhea. Genitourinary: Negative for dysuria.Positive for increased frequency.  Musculoskeletal: Right side flank pain. Skin: Negative for rash. Neurological: Negative for headaches.  Negative focal weakness or numbness. ____________________________________________   PHYSICAL EXAM:  VITAL SIGNS: ED Triage Vitals  Enc Vitals Group     BP 02/24/17 2044 (!) 173/94     Pulse Rate 02/24/17 2044 67     Resp 02/24/17 2044 19     Temp 02/24/17 2044 98.4 F (36.9 C)     Temp Source 02/24/17 2044 Oral     SpO2 02/24/17 2044 97 %     Weight 02/24/17 2044 130 lb (59 kg)     Height 02/24/17 2044 4\' 10"  (1.473 m)     Head Circumference --      Peak Flow --      Pain Score 02/24/17 2050 0     Pain Loc --       Pain Edu? --      Excl. in [place]? --     Constitutional: Alert and oriented. Well appearing and in no acute distress.  Eyes: Conjunctivae are normal. Nose: No congestion/rhinorrhea/epistaxis. Mouth/Throat: Mucous membranes are moist. Cardiovascular: Normal rate, regular rhythm. Normal distal pulses. Respiratory: Normal respiratory effort. Gastrointestinal: Soft and nontender. No distention. Genitourinary: No pelvic pain, no dysuria, increased frequency. No odor. R CVA tenderness.  Musculoskeletal: Nontender with normal range of motion in all extremities. Neurologic: Normal speech and language.  Skin:  Skin is warm, dry and intact. No rash noted. Psychiatric: Mood and affect are normal.  ____________________________________________   LABS (all labs ordered are listed, but only abnormal results are displayed)  Labs Reviewed  URINALYSIS, COMPLETE (UACMP) WITH MICROSCOPIC - Abnormal; Notable for the following:       Result Value   Color, Urine COLORLESS (*)    APPearance CLEAR (*)    Specific Gravity, Urine 1.002 (*)    Hgb urine  dipstick MODERATE (*)    Leukocytes, UA SMALL (*)    All other components within normal limits   ____________________________________________  EKG none ____________________________________________  RADIOLOGY CT renal study IMPRESSION: 1. No renal stones or obstructive uropathy. No evidence of acute abnormality. 2. Colonic diverticulosis without acute inflammation. 3. Small to moderate hiatal hernia. ___________________________________________   PROCEDURES  Procedure(s) performed: no    Critical Care performed: no ____________________________________________   INITIAL IMPRESSION / ASSESSMENT AND PLAN / ED COURSE  Pertinent labs & imaging results that were available during my care of the patient were reviewed by me and considered in my medical decision making (see chart for details).  Patient presents with urinary frequency, right flank  pain and intermittent dysuria. Physical exam, labs and imaging are reassuring of no evidence of renal stone, pyelonephritis. Based on patient's physical symptoms and UA labs for presence of leukocytes patient given prescription for levofloxacin for empiric treatment if patient chooses this course of treatment . Patient plans to follow up with her PCP.   Patient / Family informed of clinical course, understand medical decision-making process, and agree with plan.  Patient was advised to follow up with PCP and was also advised to return to the emergency department for symptoms that change or worsen if unable to schedule an appointment.     If controlled substance prescribed during this visit, 12 month history viewed on the [place] prior to issuing an initial prescription for Schedule II or III opiod. ____________________________________________   FINAL CLINICAL IMPRESSION(S) / ED DIAGNOSES  Final diagnoses:  Pyelonephritis  Urinary frequency       NEW MEDICATIONS STARTED DURING THIS VISIT:  Discharge Medication List as of 02/24/2017 10:52 PM    START taking these medications   Details  levofloxacin (LEVAQUIN) 750 MG tablet Take 1 tablet (750 mg total) by mouth daily., Starting Sun 02/24/2017, Until Fri 03/01/2017, Print         Note:  This document was prepared using Dragon voice recognition software and may include unintentional dictation errors.   Alric Quan 02/24/17 2313    Carrie Mew, MD 02/24/17 2325

## 2017-02-24 NOTE — ED Triage Notes (Signed)
Pt presents to ED c/o urinary frequency and burning x24 hours

## 2017-02-24 NOTE — ED Notes (Signed)
Pt states urinary frequency, burning, discomfort during urination since 1700 today. Pt states "now that I have all this, my blood pressure is up and I have a headache. I get headaches you know when my blood pressure is high." pt ambulatory without difficulty to provide urine sample.

## 2017-02-26 DIAGNOSIS — D692 Other nonthrombocytopenic purpura: Secondary | ICD-10-CM | POA: Diagnosis not present

## 2017-02-26 DIAGNOSIS — L814 Other melanin hyperpigmentation: Secondary | ICD-10-CM | POA: Diagnosis not present

## 2017-02-26 DIAGNOSIS — L821 Other seborrheic keratosis: Secondary | ICD-10-CM | POA: Diagnosis not present

## 2017-02-26 DIAGNOSIS — L820 Inflamed seborrheic keratosis: Secondary | ICD-10-CM | POA: Diagnosis not present

## 2017-02-26 DIAGNOSIS — Z1283 Encounter for screening for malignant neoplasm of skin: Secondary | ICD-10-CM | POA: Diagnosis not present

## 2017-02-28 DIAGNOSIS — R091 Pleurisy: Secondary | ICD-10-CM | POA: Diagnosis not present

## 2017-02-28 DIAGNOSIS — I10 Essential (primary) hypertension: Secondary | ICD-10-CM | POA: Diagnosis not present

## 2017-02-28 DIAGNOSIS — K219 Gastro-esophageal reflux disease without esophagitis: Secondary | ICD-10-CM | POA: Diagnosis not present

## 2017-02-28 DIAGNOSIS — R350 Frequency of micturition: Secondary | ICD-10-CM | POA: Diagnosis not present

## 2017-02-28 DIAGNOSIS — R1084 Generalized abdominal pain: Secondary | ICD-10-CM | POA: Diagnosis not present

## 2017-03-01 DIAGNOSIS — Z79899 Other long term (current) drug therapy: Secondary | ICD-10-CM | POA: Diagnosis not present

## 2017-03-08 ENCOUNTER — Other Ambulatory Visit: Payer: Self-pay | Admitting: Internal Medicine

## 2017-03-08 DIAGNOSIS — K219 Gastro-esophageal reflux disease without esophagitis: Secondary | ICD-10-CM | POA: Diagnosis not present

## 2017-03-08 DIAGNOSIS — N179 Acute kidney failure, unspecified: Secondary | ICD-10-CM

## 2017-03-08 DIAGNOSIS — I10 Essential (primary) hypertension: Secondary | ICD-10-CM | POA: Diagnosis not present

## 2017-03-08 DIAGNOSIS — E782 Mixed hyperlipidemia: Secondary | ICD-10-CM | POA: Diagnosis not present

## 2017-03-08 DIAGNOSIS — D649 Anemia, unspecified: Secondary | ICD-10-CM | POA: Diagnosis not present

## 2017-03-14 ENCOUNTER — Ambulatory Visit
Admission: RE | Admit: 2017-03-14 | Discharge: 2017-03-14 | Disposition: A | Discharge status: Home / Self Care | Payer: PPO | Source: Ambulatory Visit | Attending: Internal Medicine | Admitting: Internal Medicine

## 2017-03-14 DIAGNOSIS — N179 Acute kidney failure, unspecified: Secondary | ICD-10-CM

## 2017-03-14 DIAGNOSIS — N289 Disorder of kidney and ureter, unspecified: Secondary | ICD-10-CM | POA: Diagnosis not present

## 2017-03-18 DIAGNOSIS — R600 Localized edema: Secondary | ICD-10-CM | POA: Diagnosis not present

## 2017-03-18 DIAGNOSIS — I10 Essential (primary) hypertension: Secondary | ICD-10-CM | POA: Diagnosis not present

## 2017-03-18 DIAGNOSIS — N183 Chronic kidney disease, stage 3 (moderate): Secondary | ICD-10-CM | POA: Diagnosis not present

## 2017-03-18 DIAGNOSIS — I420 Dilated cardiomyopathy: Secondary | ICD-10-CM | POA: Diagnosis not present

## 2017-03-26 DIAGNOSIS — Z961 Presence of intraocular lens: Secondary | ICD-10-CM | POA: Diagnosis not present

## 2017-03-27 DIAGNOSIS — I495 Sick sinus syndrome: Secondary | ICD-10-CM | POA: Diagnosis not present

## 2017-03-27 DIAGNOSIS — I420 Dilated cardiomyopathy: Secondary | ICD-10-CM | POA: Diagnosis not present

## 2017-03-27 DIAGNOSIS — I10 Essential (primary) hypertension: Secondary | ICD-10-CM | POA: Diagnosis not present

## 2017-04-12 DIAGNOSIS — D649 Anemia, unspecified: Secondary | ICD-10-CM | POA: Diagnosis not present

## 2017-04-12 DIAGNOSIS — N289 Disorder of kidney and ureter, unspecified: Secondary | ICD-10-CM | POA: Diagnosis not present

## 2017-04-12 DIAGNOSIS — Z79899 Other long term (current) drug therapy: Secondary | ICD-10-CM | POA: Diagnosis not present

## 2017-04-12 DIAGNOSIS — I10 Essential (primary) hypertension: Secondary | ICD-10-CM | POA: Diagnosis not present

## 2017-04-12 DIAGNOSIS — K219 Gastro-esophageal reflux disease without esophagitis: Secondary | ICD-10-CM | POA: Diagnosis not present

## 2017-04-18 ENCOUNTER — Other Ambulatory Visit: Payer: Self-pay | Admitting: Student

## 2017-04-18 DIAGNOSIS — K219 Gastro-esophageal reflux disease without esophagitis: Secondary | ICD-10-CM

## 2017-04-18 DIAGNOSIS — K449 Diaphragmatic hernia without obstruction or gangrene: Secondary | ICD-10-CM | POA: Diagnosis not present

## 2017-04-26 DIAGNOSIS — I420 Dilated cardiomyopathy: Secondary | ICD-10-CM | POA: Diagnosis not present

## 2017-05-03 ENCOUNTER — Ambulatory Visit
Admission: RE | Admit: 2017-05-03 | Discharge: 2017-05-03 | Disposition: A | Discharge status: Home / Self Care | Payer: PPO | Source: Ambulatory Visit | Attending: Student | Admitting: Student

## 2017-05-03 DIAGNOSIS — K219 Gastro-esophageal reflux disease without esophagitis: Secondary | ICD-10-CM | POA: Insufficient documentation

## 2017-05-03 DIAGNOSIS — K449 Diaphragmatic hernia without obstruction or gangrene: Secondary | ICD-10-CM | POA: Insufficient documentation

## 2017-05-03 DIAGNOSIS — K228 Other specified diseases of esophagus: Secondary | ICD-10-CM | POA: Insufficient documentation

## 2017-05-03 DIAGNOSIS — K210 Gastro-esophageal reflux disease with esophagitis: Secondary | ICD-10-CM | POA: Diagnosis not present

## 2017-05-28 DIAGNOSIS — I420 Dilated cardiomyopathy: Secondary | ICD-10-CM | POA: Diagnosis not present

## 2017-06-10 ENCOUNTER — Other Ambulatory Visit: Payer: Self-pay | Admitting: Internal Medicine

## 2017-06-10 DIAGNOSIS — Z1231 Encounter for screening mammogram for malignant neoplasm of breast: Secondary | ICD-10-CM

## 2017-07-12 DIAGNOSIS — E7800 Pure hypercholesterolemia, unspecified: Secondary | ICD-10-CM | POA: Diagnosis not present

## 2017-07-12 DIAGNOSIS — Z79899 Other long term (current) drug therapy: Secondary | ICD-10-CM | POA: Diagnosis not present

## 2017-07-19 DIAGNOSIS — Z0000 Encounter for general adult medical examination without abnormal findings: Secondary | ICD-10-CM | POA: Diagnosis not present

## 2017-07-19 DIAGNOSIS — E782 Mixed hyperlipidemia: Secondary | ICD-10-CM | POA: Diagnosis not present

## 2017-07-19 DIAGNOSIS — E538 Deficiency of other specified B group vitamins: Secondary | ICD-10-CM | POA: Diagnosis not present

## 2017-07-19 DIAGNOSIS — K219 Gastro-esophageal reflux disease without esophagitis: Secondary | ICD-10-CM | POA: Diagnosis not present

## 2017-07-19 DIAGNOSIS — R05 Cough: Secondary | ICD-10-CM | POA: Diagnosis not present

## 2017-07-19 DIAGNOSIS — I10 Essential (primary) hypertension: Secondary | ICD-10-CM | POA: Diagnosis not present

## 2017-07-19 DIAGNOSIS — I495 Sick sinus syndrome: Secondary | ICD-10-CM | POA: Diagnosis not present

## 2017-08-05 DIAGNOSIS — J189 Pneumonia, unspecified organism: Secondary | ICD-10-CM | POA: Diagnosis not present

## 2017-08-16 ENCOUNTER — Ambulatory Visit
Admission: RE | Admit: 2017-08-16 | Discharge: 2017-08-16 | Disposition: A | Discharge status: Home / Self Care | Payer: PPO | Source: Ambulatory Visit | Attending: Internal Medicine | Admitting: Internal Medicine

## 2017-08-16 DIAGNOSIS — Z1231 Encounter for screening mammogram for malignant neoplasm of breast: Secondary | ICD-10-CM | POA: Diagnosis not present

## 2017-08-27 DIAGNOSIS — I495 Sick sinus syndrome: Secondary | ICD-10-CM | POA: Diagnosis not present

## 2017-09-25 DIAGNOSIS — E782 Mixed hyperlipidemia: Secondary | ICD-10-CM | POA: Diagnosis not present

## 2017-09-25 DIAGNOSIS — I420 Dilated cardiomyopathy: Secondary | ICD-10-CM | POA: Diagnosis not present

## 2017-09-25 DIAGNOSIS — I10 Essential (primary) hypertension: Secondary | ICD-10-CM | POA: Diagnosis not present

## 2017-09-25 DIAGNOSIS — L03115 Cellulitis of right lower limb: Secondary | ICD-10-CM | POA: Diagnosis not present

## 2017-09-25 DIAGNOSIS — N183 Chronic kidney disease, stage 3 (moderate): Secondary | ICD-10-CM | POA: Diagnosis not present

## 2017-10-18 DIAGNOSIS — E010 Iodine-deficiency related diffuse (endemic) goiter: Secondary | ICD-10-CM | POA: Diagnosis not present

## 2017-10-18 DIAGNOSIS — I10 Essential (primary) hypertension: Secondary | ICD-10-CM | POA: Diagnosis not present

## 2017-10-18 DIAGNOSIS — Z79899 Other long term (current) drug therapy: Secondary | ICD-10-CM | POA: Diagnosis not present

## 2017-10-18 DIAGNOSIS — I420 Dilated cardiomyopathy: Secondary | ICD-10-CM | POA: Diagnosis not present

## 2017-11-01 DIAGNOSIS — E782 Mixed hyperlipidemia: Secondary | ICD-10-CM | POA: Diagnosis not present

## 2017-11-01 DIAGNOSIS — E010 Iodine-deficiency related diffuse (endemic) goiter: Secondary | ICD-10-CM | POA: Diagnosis not present

## 2017-11-01 DIAGNOSIS — Z79899 Other long term (current) drug therapy: Secondary | ICD-10-CM | POA: Diagnosis not present

## 2017-11-01 DIAGNOSIS — I10 Essential (primary) hypertension: Secondary | ICD-10-CM | POA: Diagnosis not present

## 2017-11-01 DIAGNOSIS — I420 Dilated cardiomyopathy: Secondary | ICD-10-CM | POA: Diagnosis not present

## 2017-11-01 DIAGNOSIS — N183 Chronic kidney disease, stage 3 (moderate): Secondary | ICD-10-CM | POA: Diagnosis not present

## 2017-12-03 DIAGNOSIS — M5030 Other cervical disc degeneration, unspecified cervical region: Secondary | ICD-10-CM | POA: Diagnosis not present

## 2017-12-03 DIAGNOSIS — M542 Cervicalgia: Secondary | ICD-10-CM | POA: Diagnosis not present

## 2017-12-16 DIAGNOSIS — K449 Diaphragmatic hernia without obstruction or gangrene: Secondary | ICD-10-CM | POA: Diagnosis not present

## 2017-12-16 DIAGNOSIS — K219 Gastro-esophageal reflux disease without esophagitis: Secondary | ICD-10-CM | POA: Diagnosis not present

## 2017-12-26 ENCOUNTER — Encounter: Payer: Self-pay | Admitting: Emergency Medicine

## 2017-12-26 ENCOUNTER — Emergency Department
Admission: EM | Admit: 2017-12-26 | Discharge: 2017-12-26 | Disposition: A | Discharge status: Home / Self Care | Payer: PPO | Attending: Emergency Medicine | Admitting: Emergency Medicine

## 2017-12-26 DIAGNOSIS — R5383 Other fatigue: Secondary | ICD-10-CM | POA: Diagnosis present

## 2017-12-26 DIAGNOSIS — I10 Essential (primary) hypertension: Secondary | ICD-10-CM | POA: Insufficient documentation

## 2017-12-26 DIAGNOSIS — D649 Anemia, unspecified: Secondary | ICD-10-CM | POA: Diagnosis not present

## 2017-12-26 DIAGNOSIS — Z95810 Presence of automatic (implantable) cardiac defibrillator: Secondary | ICD-10-CM | POA: Insufficient documentation

## 2017-12-26 DIAGNOSIS — R197 Diarrhea, unspecified: Secondary | ICD-10-CM | POA: Diagnosis not present

## 2017-12-26 DIAGNOSIS — Z7982 Long term (current) use of aspirin: Secondary | ICD-10-CM | POA: Insufficient documentation

## 2017-12-26 DIAGNOSIS — E860 Dehydration: Secondary | ICD-10-CM | POA: Diagnosis not present

## 2017-12-26 LAB — CBC WITH DIFFERENTIAL/PLATELET
BASOS ABS: 0 10*3/uL (ref 0–0.1)
BASOS PCT: 0 %
Eosinophils Absolute: 0.2 10*3/uL (ref 0–0.7)
Eosinophils Relative: 4 %
HEMATOCRIT: 28.6 % — AB (ref 35.0–47.0)
Hemoglobin: 9.9 g/dL — ABNORMAL LOW (ref 12.0–16.0)
Lymphocytes Relative: 18 %
Lymphs Abs: 1 10*3/uL (ref 1.0–3.6)
MCH: 31 pg (ref 26.0–34.0)
MCHC: 34.6 g/dL (ref 32.0–36.0)
MCV: 89.7 fL (ref 80.0–100.0)
MONO ABS: 0.6 10*3/uL (ref 0.2–0.9)
Monocytes Relative: 11 %
NEUTROS ABS: 3.8 10*3/uL (ref 1.4–6.5)
Neutrophils Relative %: 67 %
PLATELETS: 190 10*3/uL (ref 150–440)
RBC: 3.19 MIL/uL — ABNORMAL LOW (ref 3.80–5.20)
RDW: 14 % (ref 11.5–14.5)
WBC: 5.6 10*3/uL (ref 3.6–11.0)

## 2017-12-26 LAB — COMPREHENSIVE METABOLIC PANEL
ALT: 11 U/L — ABNORMAL LOW (ref 14–54)
AST: 22 U/L (ref 15–41)
Albumin: 3.7 g/dL (ref 3.5–5.0)
Alkaline Phosphatase: 42 U/L (ref 38–126)
Anion gap: 8 (ref 5–15)
BILIRUBIN TOTAL: 0.4 mg/dL (ref 0.3–1.2)
BUN: 12 mg/dL (ref 6–20)
CHLORIDE: 104 mmol/L (ref 101–111)
CO2: 23 mmol/L (ref 22–32)
Calcium: 8.5 mg/dL — ABNORMAL LOW (ref 8.9–10.3)
Creatinine, Ser: 1.42 mg/dL — ABNORMAL HIGH (ref 0.44–1.00)
GFR calc Af Amer: 40 mL/min — ABNORMAL LOW (ref >60)
GFR calc non Af Amer: 34 mL/min — ABNORMAL LOW (ref >60)
GLUCOSE: 117 mg/dL — AB (ref 65–99)
POTASSIUM: 3 mmol/L — AB (ref 3.5–5.1)
Sodium: 135 mmol/L (ref 135–145)
Total Protein: 6.5 g/dL (ref 6.5–8.1)

## 2017-12-26 NOTE — ED Provider Notes (Signed)
[facility] Emergency Department Provider Note  ____________________________________________   First MD Initiated Contact with Patient 12/26/17 1529     (approximate)  I have reviewed the triage vital signs and the nursing notes.   HISTORY  Chief Complaint Dehydration   HPI Michele Montgomery is a 77 y.o. female who self presents to the emergency department with fatigue and lightheadedness.  She began a new acid reflux medication on Monday and has had 2-3 loose watery stools a day ever since.  Today she called her gastroenterologist noting the increased loose stools and asked if she could take Imodium.  Imodium is helped somewhat but she still feels like it is difficult for her to catch up on her fluids.  She was sitting at her desk today and nearly passed out from lightheadedness so she called her gastroenterologist who advised her to come to the emergency department.  She now feels fine and is requesting to go home.  She denies chest pain shortness of breath abdominal pain nausea or vomiting.  She is able to eat and drink without difficulty.  She has no blood in her stool.  She has mild to moderate cramping diffuse abdominal pain nonradiating worse with defecation improved after defecation.  Past Medical History:  Diagnosis Date  . AICD (automatic cardioverter/defibrillator) present    2011  . Anemia   . Arthritis    osteo  . Colitis   . Depression   . Dilated cardiomyopathy ([place])   . Diverticulitis   . Dysrhythmia   . Enlarged heart   . Fracture    LEFT ANKLE/ WEARING BRACE  . GERD (gastroesophageal reflux disease)   . Hearing aid worn   . Hyperlipidemia   . Hypertension   . Osteoporosis   . Ovarian cyst   . Spinal stenosis   . Thyromegaly   . Venous stasis     There are no active problems to display for this patient.   Past Surgical History:  Procedure Laterality Date  . CARDIAC DEFIBRILLATOR PLACEMENT    . CARDIAC DEFIBRILLATOR PLACEMENT     . CATARACT EXTRACTION W/PHACO Left 03/05/2016   Procedure: CATARACT EXTRACTION PHACO AND INTRAOCULAR LENS PLACEMENT (IOC);  Surgeon: Estill Cotta, MD;  Location: [facility] ORS;  Service: Ophthalmology;  Laterality: Left;  [place] 00:58 AP% 23.9 CDE 24.51 fluid pack lot # [id number]  . CATARACT EXTRACTION W/PHACO Right 07/25/2016   Procedure: CATARACT EXTRACTION PHACO AND INTRAOCULAR LENS PLACEMENT ([place]);  Surgeon: Estill Cotta, MD;  Location: [facility] ORS;  Service: Ophthalmology;  Laterality: Right;  [place] 1.05 AP% 20.4 CDE 21.59 fluid pack lot # [id number]  . COLONOSCOPY WITH PROPOFOL N/A 08/01/2015   Procedure: COLONOSCOPY WITH PROPOFOL;  Surgeon: Josefine Class, MD;  Location: [facility] ENDOSCOPY;  Service: Endoscopy;  Laterality: N/A;  . ESOPHAGOGASTRODUODENOSCOPY (EGD) WITH PROPOFOL N/A 08/01/2015   Procedure: ESOPHAGOGASTRODUODENOSCOPY (EGD) WITH PROPOFOL;  Surgeon: Josefine Class, MD;  Location: [facility] ENDOSCOPY;  Service: Endoscopy;  Laterality: N/A;  . IMPLANTABLE CARDIOVERTER DEFIBRILLATOR (ICD) GENERATOR CHANGE Left 01/09/2017   Procedure: permanent biventricular pacemaker and implantable cardioverter defibrillator;  Surgeon: Isaias Cowman, MD;  Location: [facility] ORS;  Service: Cardiovascular;  Laterality: Left;  . PACEMAKER PLACEMENT      Prior to Admission medications   Medication Sig Start Date End Date Taking? Authorizing Provider  aspirin 81 MG tablet Take 1 tablet by mouth at bedtime.  11/25/09   [provider]  carvedilol (COREG) 25 MG tablet Take 25 mg by mouth 2 (  two) times daily.  04/02/15   [provider]  cetirizine (ZYRTEC) 10 MG tablet Take 10 mg by mouth at bedtime.     [provider]  Cholecalciferol (VITAMIN D3) 5000 units CAPS Take 5,000 Units by mouth daily.    [provider]  citalopram (CELEXA) 20 MG tablet Take 20 mg by mouth at bedtime.     [provider]  clarithromycin (BIAXIN) 250 MG tablet Take 1 tablet (250  mg total) by mouth 2 (two) times daily. 01/09/17   Paraschos, Alexander, MD  clarithromycin (BIAXIN) 250 MG tablet Take 1 tablet (250 mg total) by mouth 2 (two) times daily. 01/09/17   Paraschos, Alexander, MD  cyclobenzaprine (FLEXERIL) 5 MG tablet Take 5 mg by mouth 2 (two) times daily as needed for muscle spasms. 06/19/16   [provider]  IBU 800 MG tablet Take 800 mg by mouth 2 (two) times daily as needed for pain. 12/13/16   [provider]  LORazepam (ATIVAN) 0.5 MG tablet Take 1 mg by mouth at bedtime.  06/09/15   [provider]  pantoprazole (PROTONIX) 40 MG tablet Take 1 tablet by mouth daily. 03/29/15   [provider]  Probiotic Product (ALIGN PO) Take 1 tablet by mouth at bedtime.    [provider]  ramipril (ALTACE) 10 MG capsule Take 10 mg by mouth daily.  03/29/15   [provider]  simvastatin (ZOCOR) 40 MG tablet Take 40 mg by mouth at bedtime.  04/03/15   [provider]  vitamin B-12 (CYANOCOBALAMIN) 1000 MCG tablet Take 1,000 mcg by mouth daily.    [provider]    Allergies Penicillins; Ciprofloxacin; Hydrochlorothiazide; Metronidazole; Other; and Prednisone  Family History  Problem Relation Age of Onset  . Breast cancer Neg Hx     Social History Social History   Tobacco Use  . Smoking status: Never Smoker  . Smokeless tobacco: Never Used  Substance Use Topics  . Alcohol use: No  . Drug use: No    Review of Systems Constitutional: No fever/chills Eyes: No visual changes. ENT: No sore throat. Cardiovascular: Denies chest pain. Respiratory: Denies shortness of breath. Gastrointestinal: Positive for abdominal pain.  No nausea, no vomiting.  Positive for diarrhea.  No constipation. Genitourinary: Negative for dysuria. Musculoskeletal: Negative for back pain. Skin: Negative for rash. Neurological: Negative for headaches, focal weakness or  numbness.   ____________________________________________   PHYSICAL EXAM:  VITAL SIGNS: ED Triage Vitals [12/26/17 1452]  Enc Vitals Group     BP (!) 157/62     Pulse Rate 70     Resp 18     Temp 98.5 F (36.9 C)     Temp Source Oral     SpO2 97 %     Weight 130 lb (59 kg)     Height 4\' 10"  (1.473 m)     Head Circumference      Peak Flow      Pain Score      Pain Loc      Pain Edu?      Excl. in [place]?     Constitutional: Alert and oriented x4 joking laughing well-appearing nontoxic no diaphoresis speaks full clear sentences Eyes: PERRL EOMI. Head: Atraumatic. Nose: No congestion/rhinnorhea. Mouth/Throat: No trismus Neck: No stridor.   Cardiovascular: Normal rate, regular rhythm. Grossly normal heart sounds.  Good peripheral circulation. Respiratory: Normal respiratory effort.  No retractions. Lungs CTAB and moving good air Gastrointestinal: Soft nondistended nontender no rebound  or guarding no peritonitis no McBurney's tenderness negative Rovsing's Musculoskeletal: No lower extremity edema   Neurologic:  Normal speech and language. No gross focal neurologic deficits are appreciated. Skin:  Skin is warm, dry and intact. No rash noted. Psychiatric: Mood and affect are normal. Speech and behavior are normal.    ____________________________________________   DIFFERENTIAL includes but not limited to  Dehydration, functional diarrhea, infectious diarrhea, Clostridium difficile, anemia ____________________________________________   LABS (all labs ordered are listed, but only abnormal results are displayed)  Labs Reviewed  CBC WITH DIFFERENTIAL/PLATELET - Abnormal; Notable for the following components:      Result Value   RBC 3.19 (*)    Hemoglobin 9.9 (*)    HCT 28.6 (*)    All other components within normal limits  COMPREHENSIVE METABOLIC PANEL - Abnormal; Notable for the following components:   Potassium 3.0 (*)    Glucose, Bld 117 (*)    Creatinine, Ser 1.42  (*)    Calcium 8.5 (*)    ALT 11 (*)    GFR calc non Af Amer 34 (*)    GFR calc Af Amer 40 (*)    All other components within normal limits    Lab work reviewed by me shows hemoglobin 9.9 down from previous of 11.5 __________________________________________  EKG   ____________________________________________  RADIOLOGY   ____________________________________________   PROCEDURES  Procedure(s) performed: no  Procedures  Critical Care performed: no  Observation: no ____________________________________________   INITIAL IMPRESSION / ASSESSMENT AND PLAN / ED COURSE  Pertinent labs & imaging results that were available during my care of the patient were reviewed by me and considered in my medical decision making (see chart for details).  Patient arrives hemodynamically stable and very well-appearing.  Abdominal exam is benign.  She is able to orally rehydrate.  I offered her IV fluids however she declined.  She has no risk factors for Clostridium difficile.  I explained to the patient that her hemoglobin has drifted down slowly and emphasized the importance of follow-up with her primary care physician this week.  She is discharged home in improved condition verbalizes understanding and agreement with the plan.      ____________________________________________   FINAL CLINICAL IMPRESSION(S) / ED DIAGNOSES  Final diagnoses:  Dehydration  Diarrhea, unspecified type  Anemia, unspecified type      NEW MEDICATIONS STARTED DURING THIS VISIT:  New Prescriptions   No medications on file     Note:  This document was prepared using Dragon voice recognition software and may include unintentional dictation errors.     Darel Hong, MD 12/26/17 [phone]

## 2017-12-26 NOTE — ED Triage Notes (Signed)
Pt arrived after talking to her gastroenterologist who suggested patient be seen in the ED for blood work and fluids for possible dehydration. Pt ambulatory in triage and in NAD.

## 2017-12-26 NOTE — ED Notes (Signed)
ED Provider at bedside. 

## 2017-12-26 NOTE — Discharge Instructions (Signed)
Fortunately today your vital signs were reassuring and your blood work was mostly normal.  Her hemoglobin has gone down to 10 from 11.5 and while this does not require an emergency blood transfusion it is important to let your primary care doctor know about this so it can be further evaluated.  Please follow-up with your primary care physician Dr. Doy Hutching within 2 days for reevaluation and return to the emergency department sooner for any concerns whatsoever.  It was a pleasure to take care of you today, and thank you for coming to our emergency department.  If you have any questions or concerns before leaving please ask the nurse to grab me and I'm more than happy to go through your aftercare instructions again.  If you were prescribed any opioid pain medication today such as Norco, Vicodin, Percocet, morphine, hydrocodone, or oxycodone please make sure you do not drive when you are taking this medication as it can alter your ability to drive safely.  If you have any concerns once you are home that you are not improving or are in fact getting worse before you can make it to your follow-up appointment, please do not hesitate to call 911 and come back for further evaluation.  Michele Hong, MD  Results for orders placed or performed during the hospital encounter of 12/26/17  CBC with Differential  Result Value Ref Range   WBC 5.6 3.6 - 11.0 K/uL   RBC 3.19 (L) 3.80 - 5.20 MIL/uL   Hemoglobin 9.9 (L) 12.0 - 16.0 g/dL   HCT 28.6 (L) 35.0 - 47.0 %   MCV 89.7 80.0 - 100.0 fL   MCH 31.0 26.0 - 34.0 pg   MCHC 34.6 32.0 - 36.0 g/dL   RDW 14.0 11.5 - 14.5 %   Platelets 190 150 - 440 K/uL   Neutrophils Relative % 67 %   Neutro Abs 3.8 1.4 - 6.5 K/uL   Lymphocytes Relative 18 %   Lymphs Abs 1.0 1.0 - 3.6 K/uL   Monocytes Relative 11 %   Monocytes Absolute 0.6 0.2 - 0.9 K/uL   Eosinophils Relative 4 %   Eosinophils Absolute 0.2 0 - 0.7 K/uL   Basophils Relative 0 %   Basophils Absolute 0.0 0 - 0.1  K/uL  Comprehensive metabolic panel  Result Value Ref Range   Sodium 135 135 - 145 mmol/L   Potassium 3.0 (L) 3.5 - 5.1 mmol/L   Chloride 104 101 - 111 mmol/L   CO2 23 22 - 32 mmol/L   Glucose, Bld 117 (H) 65 - 99 mg/dL   BUN 12 6 - 20 mg/dL   Creatinine, Ser 1.42 (H) 0.44 - 1.00 mg/dL   Calcium 8.5 (L) 8.9 - 10.3 mg/dL   Total Protein 6.5 6.5 - 8.1 g/dL   Albumin 3.7 3.5 - 5.0 g/dL   AST 22 15 - 41 U/L   ALT 11 (L) 14 - 54 U/L   Alkaline Phosphatase 42 38 - 126 U/L   Total Bilirubin 0.4 0.3 - 1.2 mg/dL   GFR calc non Af Amer 34 (L) >60 mL/min   GFR calc Af Amer 40 (L) >60 mL/min   Anion gap 8 5 - 15

## 2017-12-26 NOTE — ED Notes (Signed)
Pt ambulatory upon discharge. Verbalized understanding of discharge instructions and follow-up care. VSS. Skin warm and dry. A&O x4.

## 2017-12-26 NOTE — ED Notes (Signed)
Informed RN that patient has been roomed and is ready for evaluation.  Patient in NAD at this time and call bell placed within reach.   

## 2018-01-27 DIAGNOSIS — I495 Sick sinus syndrome: Secondary | ICD-10-CM | POA: Diagnosis not present

## 2018-01-27 DIAGNOSIS — E782 Mixed hyperlipidemia: Secondary | ICD-10-CM | POA: Diagnosis not present

## 2018-01-27 DIAGNOSIS — R079 Chest pain, unspecified: Secondary | ICD-10-CM | POA: Diagnosis not present

## 2018-01-27 DIAGNOSIS — I10 Essential (primary) hypertension: Secondary | ICD-10-CM | POA: Diagnosis not present

## 2018-01-27 DIAGNOSIS — I420 Dilated cardiomyopathy: Secondary | ICD-10-CM | POA: Diagnosis not present

## 2018-01-27 DIAGNOSIS — Z9889 Other specified postprocedural states: Secondary | ICD-10-CM | POA: Diagnosis not present

## 2018-02-14 DIAGNOSIS — H6123 Impacted cerumen, bilateral: Secondary | ICD-10-CM | POA: Diagnosis not present

## 2018-02-14 DIAGNOSIS — J301 Allergic rhinitis due to pollen: Secondary | ICD-10-CM | POA: Diagnosis not present

## 2018-02-14 DIAGNOSIS — H903 Sensorineural hearing loss, bilateral: Secondary | ICD-10-CM | POA: Diagnosis not present

## 2018-02-21 DIAGNOSIS — E010 Iodine-deficiency related diffuse (endemic) goiter: Secondary | ICD-10-CM | POA: Diagnosis not present

## 2018-02-21 DIAGNOSIS — I10 Essential (primary) hypertension: Secondary | ICD-10-CM | POA: Diagnosis not present

## 2018-02-21 DIAGNOSIS — E782 Mixed hyperlipidemia: Secondary | ICD-10-CM | POA: Diagnosis not present

## 2018-02-21 DIAGNOSIS — Z79899 Other long term (current) drug therapy: Secondary | ICD-10-CM | POA: Diagnosis not present

## 2018-02-25 DIAGNOSIS — E010 Iodine-deficiency related diffuse (endemic) goiter: Secondary | ICD-10-CM | POA: Diagnosis not present

## 2018-02-25 DIAGNOSIS — I420 Dilated cardiomyopathy: Secondary | ICD-10-CM | POA: Diagnosis not present

## 2018-02-25 DIAGNOSIS — Z1211 Encounter for screening for malignant neoplasm of colon: Secondary | ICD-10-CM | POA: Diagnosis not present

## 2018-02-25 DIAGNOSIS — Z0000 Encounter for general adult medical examination without abnormal findings: Secondary | ICD-10-CM | POA: Diagnosis not present

## 2018-02-25 DIAGNOSIS — N183 Chronic kidney disease, stage 3 (moderate): Secondary | ICD-10-CM | POA: Diagnosis not present

## 2018-02-25 DIAGNOSIS — I10 Essential (primary) hypertension: Secondary | ICD-10-CM | POA: Diagnosis not present

## 2018-02-25 DIAGNOSIS — Z79899 Other long term (current) drug therapy: Secondary | ICD-10-CM | POA: Diagnosis not present

## 2018-02-25 DIAGNOSIS — E782 Mixed hyperlipidemia: Secondary | ICD-10-CM | POA: Diagnosis not present

## 2018-03-04 DIAGNOSIS — D692 Other nonthrombocytopenic purpura: Secondary | ICD-10-CM | POA: Diagnosis not present

## 2018-03-04 DIAGNOSIS — L821 Other seborrheic keratosis: Secondary | ICD-10-CM | POA: Diagnosis not present

## 2018-03-04 DIAGNOSIS — L812 Freckles: Secondary | ICD-10-CM | POA: Diagnosis not present

## 2018-03-04 DIAGNOSIS — Z1283 Encounter for screening for malignant neoplasm of skin: Secondary | ICD-10-CM | POA: Diagnosis not present

## 2018-03-04 DIAGNOSIS — L570 Actinic keratosis: Secondary | ICD-10-CM | POA: Diagnosis not present

## 2018-03-05 DIAGNOSIS — Z1211 Encounter for screening for malignant neoplasm of colon: Secondary | ICD-10-CM | POA: Diagnosis not present

## 2018-04-22 DIAGNOSIS — I420 Dilated cardiomyopathy: Secondary | ICD-10-CM | POA: Diagnosis not present

## 2018-04-30 DIAGNOSIS — M542 Cervicalgia: Secondary | ICD-10-CM | POA: Diagnosis not present

## 2018-04-30 DIAGNOSIS — M5030 Other cervical disc degeneration, unspecified cervical region: Secondary | ICD-10-CM | POA: Diagnosis not present

## 2018-05-13 DIAGNOSIS — M19041 Primary osteoarthritis, right hand: Secondary | ICD-10-CM | POA: Diagnosis not present

## 2018-05-13 DIAGNOSIS — M79641 Pain in right hand: Secondary | ICD-10-CM | POA: Diagnosis not present

## 2018-05-13 DIAGNOSIS — M19042 Primary osteoarthritis, left hand: Secondary | ICD-10-CM | POA: Diagnosis not present

## 2018-05-13 DIAGNOSIS — M79642 Pain in left hand: Secondary | ICD-10-CM | POA: Diagnosis not present

## 2018-05-13 DIAGNOSIS — M4722 Other spondylosis with radiculopathy, cervical region: Secondary | ICD-10-CM | POA: Diagnosis not present

## 2018-05-13 DIAGNOSIS — M47812 Spondylosis without myelopathy or radiculopathy, cervical region: Secondary | ICD-10-CM | POA: Diagnosis not present

## 2018-05-14 ENCOUNTER — Other Ambulatory Visit: Payer: Self-pay | Admitting: Internal Medicine

## 2018-05-14 DIAGNOSIS — M47812 Spondylosis without myelopathy or radiculopathy, cervical region: Secondary | ICD-10-CM

## 2018-05-20 DIAGNOSIS — H26493 Other secondary cataract, bilateral: Secondary | ICD-10-CM | POA: Diagnosis not present

## 2018-05-21 DIAGNOSIS — I420 Dilated cardiomyopathy: Secondary | ICD-10-CM | POA: Diagnosis not present

## 2018-05-29 ENCOUNTER — Ambulatory Visit
Admission: RE | Admit: 2018-05-29 | Discharge: 2018-05-29 | Disposition: A | Discharge status: Home / Self Care | Payer: PPO | Source: Ambulatory Visit | Attending: Internal Medicine | Admitting: Internal Medicine

## 2018-05-29 DIAGNOSIS — M5030 Other cervical disc degeneration, unspecified cervical region: Secondary | ICD-10-CM | POA: Insufficient documentation

## 2018-05-29 DIAGNOSIS — M47812 Spondylosis without myelopathy or radiculopathy, cervical region: Secondary | ICD-10-CM | POA: Diagnosis not present

## 2018-05-29 DIAGNOSIS — M4802 Spinal stenosis, cervical region: Secondary | ICD-10-CM | POA: Diagnosis not present

## 2018-06-11 DIAGNOSIS — Z950 Presence of cardiac pacemaker: Secondary | ICD-10-CM | POA: Diagnosis not present

## 2018-06-11 DIAGNOSIS — I495 Sick sinus syndrome: Secondary | ICD-10-CM | POA: Diagnosis not present

## 2018-06-11 DIAGNOSIS — Z9889 Other specified postprocedural states: Secondary | ICD-10-CM | POA: Diagnosis not present

## 2018-06-11 DIAGNOSIS — K219 Gastro-esophageal reflux disease without esophagitis: Secondary | ICD-10-CM | POA: Diagnosis not present

## 2018-06-11 DIAGNOSIS — I420 Dilated cardiomyopathy: Secondary | ICD-10-CM | POA: Diagnosis not present

## 2018-06-11 DIAGNOSIS — R079 Chest pain, unspecified: Secondary | ICD-10-CM | POA: Diagnosis not present

## 2018-06-11 DIAGNOSIS — I10 Essential (primary) hypertension: Secondary | ICD-10-CM | POA: Diagnosis not present

## 2018-06-11 DIAGNOSIS — N183 Chronic kidney disease, stage 3 (moderate): Secondary | ICD-10-CM | POA: Diagnosis not present

## 2018-06-17 DIAGNOSIS — M4310 Spondylolisthesis, site unspecified: Secondary | ICD-10-CM | POA: Diagnosis not present

## 2018-06-17 DIAGNOSIS — M47812 Spondylosis without myelopathy or radiculopathy, cervical region: Secondary | ICD-10-CM | POA: Diagnosis not present

## 2018-06-20 DIAGNOSIS — Z79899 Other long term (current) drug therapy: Secondary | ICD-10-CM | POA: Diagnosis not present

## 2018-06-23 DIAGNOSIS — M5412 Radiculopathy, cervical region: Secondary | ICD-10-CM | POA: Diagnosis not present

## 2018-06-23 DIAGNOSIS — M5030 Other cervical disc degeneration, unspecified cervical region: Secondary | ICD-10-CM | POA: Diagnosis not present

## 2018-06-23 DIAGNOSIS — M47812 Spondylosis without myelopathy or radiculopathy, cervical region: Secondary | ICD-10-CM | POA: Diagnosis not present

## 2018-07-01 ENCOUNTER — Other Ambulatory Visit: Payer: Self-pay | Admitting: Internal Medicine

## 2018-07-01 DIAGNOSIS — E782 Mixed hyperlipidemia: Secondary | ICD-10-CM | POA: Diagnosis not present

## 2018-07-01 DIAGNOSIS — E010 Iodine-deficiency related diffuse (endemic) goiter: Secondary | ICD-10-CM | POA: Diagnosis not present

## 2018-07-01 DIAGNOSIS — Z1239 Encounter for other screening for malignant neoplasm of breast: Secondary | ICD-10-CM | POA: Diagnosis not present

## 2018-07-01 DIAGNOSIS — I10 Essential (primary) hypertension: Secondary | ICD-10-CM | POA: Diagnosis not present

## 2018-07-01 DIAGNOSIS — D649 Anemia, unspecified: Secondary | ICD-10-CM | POA: Diagnosis not present

## 2018-07-01 DIAGNOSIS — N183 Chronic kidney disease, stage 3 (moderate): Secondary | ICD-10-CM | POA: Diagnosis not present

## 2018-07-01 DIAGNOSIS — Z1231 Encounter for screening mammogram for malignant neoplasm of breast: Secondary | ICD-10-CM

## 2018-07-01 DIAGNOSIS — Z79899 Other long term (current) drug therapy: Secondary | ICD-10-CM | POA: Diagnosis not present

## 2018-07-18 DIAGNOSIS — M47812 Spondylosis without myelopathy or radiculopathy, cervical region: Secondary | ICD-10-CM | POA: Diagnosis not present

## 2018-07-18 DIAGNOSIS — M6281 Muscle weakness (generalized): Secondary | ICD-10-CM | POA: Diagnosis not present

## 2018-07-21 DIAGNOSIS — M47812 Spondylosis without myelopathy or radiculopathy, cervical region: Secondary | ICD-10-CM | POA: Diagnosis not present

## 2018-07-21 DIAGNOSIS — M6281 Muscle weakness (generalized): Secondary | ICD-10-CM | POA: Diagnosis not present

## 2018-07-25 DIAGNOSIS — M6281 Muscle weakness (generalized): Secondary | ICD-10-CM | POA: Diagnosis not present

## 2018-07-25 DIAGNOSIS — M47812 Spondylosis without myelopathy or radiculopathy, cervical region: Secondary | ICD-10-CM | POA: Diagnosis not present

## 2018-07-29 DIAGNOSIS — M47812 Spondylosis without myelopathy or radiculopathy, cervical region: Secondary | ICD-10-CM | POA: Diagnosis not present

## 2018-08-01 DIAGNOSIS — M47812 Spondylosis without myelopathy or radiculopathy, cervical region: Secondary | ICD-10-CM | POA: Diagnosis not present

## 2018-08-04 DIAGNOSIS — M47812 Spondylosis without myelopathy or radiculopathy, cervical region: Secondary | ICD-10-CM | POA: Diagnosis not present

## 2018-08-08 DIAGNOSIS — M47812 Spondylosis without myelopathy or radiculopathy, cervical region: Secondary | ICD-10-CM | POA: Diagnosis not present

## 2018-08-11 DIAGNOSIS — M47812 Spondylosis without myelopathy or radiculopathy, cervical region: Secondary | ICD-10-CM | POA: Diagnosis not present

## 2018-08-15 DIAGNOSIS — M47812 Spondylosis without myelopathy or radiculopathy, cervical region: Secondary | ICD-10-CM | POA: Diagnosis not present

## 2018-08-15 DIAGNOSIS — M6281 Muscle weakness (generalized): Secondary | ICD-10-CM | POA: Diagnosis not present

## 2018-08-19 DIAGNOSIS — M47812 Spondylosis without myelopathy or radiculopathy, cervical region: Secondary | ICD-10-CM | POA: Diagnosis not present

## 2018-08-20 DIAGNOSIS — H6123 Impacted cerumen, bilateral: Secondary | ICD-10-CM | POA: Diagnosis not present

## 2018-08-22 ENCOUNTER — Ambulatory Visit
Admission: RE | Admit: 2018-08-22 | Discharge: 2018-08-22 | Disposition: A | Discharge status: Home / Self Care | Payer: PPO | Source: Ambulatory Visit | Attending: Internal Medicine | Admitting: Internal Medicine

## 2018-08-22 DIAGNOSIS — Z1231 Encounter for screening mammogram for malignant neoplasm of breast: Secondary | ICD-10-CM | POA: Diagnosis not present

## 2018-08-22 DIAGNOSIS — M47812 Spondylosis without myelopathy or radiculopathy, cervical region: Secondary | ICD-10-CM | POA: Diagnosis not present

## 2018-08-22 DIAGNOSIS — M6281 Muscle weakness (generalized): Secondary | ICD-10-CM | POA: Diagnosis not present

## 2018-08-26 DIAGNOSIS — M47812 Spondylosis without myelopathy or radiculopathy, cervical region: Secondary | ICD-10-CM | POA: Diagnosis not present

## 2018-08-26 DIAGNOSIS — I420 Dilated cardiomyopathy: Secondary | ICD-10-CM | POA: Diagnosis not present

## 2018-08-29 DIAGNOSIS — M47812 Spondylosis without myelopathy or radiculopathy, cervical region: Secondary | ICD-10-CM | POA: Diagnosis not present

## 2018-09-01 DIAGNOSIS — M47812 Spondylosis without myelopathy or radiculopathy, cervical region: Secondary | ICD-10-CM | POA: Diagnosis not present

## 2018-09-05 DIAGNOSIS — M47812 Spondylosis without myelopathy or radiculopathy, cervical region: Secondary | ICD-10-CM | POA: Diagnosis not present

## 2018-09-18 DIAGNOSIS — I495 Sick sinus syndrome: Secondary | ICD-10-CM | POA: Diagnosis not present

## 2018-09-18 DIAGNOSIS — I10 Essential (primary) hypertension: Secondary | ICD-10-CM | POA: Diagnosis not present

## 2018-09-18 DIAGNOSIS — R0789 Other chest pain: Secondary | ICD-10-CM | POA: Diagnosis not present

## 2018-09-18 DIAGNOSIS — I420 Dilated cardiomyopathy: Secondary | ICD-10-CM | POA: Diagnosis not present

## 2018-09-26 DIAGNOSIS — I10 Essential (primary) hypertension: Secondary | ICD-10-CM | POA: Diagnosis not present

## 2018-09-26 DIAGNOSIS — E782 Mixed hyperlipidemia: Secondary | ICD-10-CM | POA: Diagnosis not present

## 2018-09-26 DIAGNOSIS — Z79899 Other long term (current) drug therapy: Secondary | ICD-10-CM | POA: Diagnosis not present

## 2018-10-02 DIAGNOSIS — I10 Essential (primary) hypertension: Secondary | ICD-10-CM | POA: Diagnosis not present

## 2018-10-02 DIAGNOSIS — I420 Dilated cardiomyopathy: Secondary | ICD-10-CM | POA: Diagnosis not present

## 2018-10-02 DIAGNOSIS — Z79899 Other long term (current) drug therapy: Secondary | ICD-10-CM | POA: Diagnosis not present

## 2018-10-02 DIAGNOSIS — Z9109 Other allergy status, other than to drugs and biological substances: Secondary | ICD-10-CM | POA: Diagnosis not present

## 2018-10-02 DIAGNOSIS — R0602 Shortness of breath: Secondary | ICD-10-CM | POA: Diagnosis not present

## 2018-10-02 DIAGNOSIS — N183 Chronic kidney disease, stage 3 (moderate): Secondary | ICD-10-CM | POA: Diagnosis not present

## 2018-10-02 DIAGNOSIS — R079 Chest pain, unspecified: Secondary | ICD-10-CM | POA: Diagnosis not present

## 2018-10-02 DIAGNOSIS — E010 Iodine-deficiency related diffuse (endemic) goiter: Secondary | ICD-10-CM | POA: Diagnosis not present

## 2018-10-21 DIAGNOSIS — R0602 Shortness of breath: Secondary | ICD-10-CM | POA: Diagnosis not present

## 2018-11-26 DIAGNOSIS — H903 Sensorineural hearing loss, bilateral: Secondary | ICD-10-CM | POA: Diagnosis not present

## 2018-12-03 DIAGNOSIS — H903 Sensorineural hearing loss, bilateral: Secondary | ICD-10-CM | POA: Diagnosis not present

## 2018-12-04 DIAGNOSIS — I420 Dilated cardiomyopathy: Secondary | ICD-10-CM | POA: Diagnosis not present

## 2018-12-12 DIAGNOSIS — J45909 Unspecified asthma, uncomplicated: Secondary | ICD-10-CM | POA: Diagnosis not present

## 2018-12-16 DIAGNOSIS — J40 Bronchitis, not specified as acute or chronic: Secondary | ICD-10-CM | POA: Diagnosis not present

## 2018-12-16 DIAGNOSIS — N183 Chronic kidney disease, stage 3 (moderate): Secondary | ICD-10-CM | POA: Diagnosis not present

## 2018-12-26 DIAGNOSIS — Z79899 Other long term (current) drug therapy: Secondary | ICD-10-CM | POA: Diagnosis not present

## 2018-12-26 DIAGNOSIS — I10 Essential (primary) hypertension: Secondary | ICD-10-CM | POA: Diagnosis not present

## 2019-01-05 DIAGNOSIS — I10 Essential (primary) hypertension: Secondary | ICD-10-CM | POA: Diagnosis not present

## 2019-01-05 DIAGNOSIS — I420 Dilated cardiomyopathy: Secondary | ICD-10-CM | POA: Diagnosis not present

## 2019-01-05 DIAGNOSIS — E010 Iodine-deficiency related diffuse (endemic) goiter: Secondary | ICD-10-CM | POA: Diagnosis not present

## 2019-01-05 DIAGNOSIS — N183 Chronic kidney disease, stage 3 (moderate): Secondary | ICD-10-CM | POA: Diagnosis not present

## 2019-01-05 DIAGNOSIS — Z0000 Encounter for general adult medical examination without abnormal findings: Secondary | ICD-10-CM | POA: Diagnosis not present

## 2019-01-05 DIAGNOSIS — Z79899 Other long term (current) drug therapy: Secondary | ICD-10-CM | POA: Diagnosis not present

## 2019-01-05 DIAGNOSIS — E782 Mixed hyperlipidemia: Secondary | ICD-10-CM | POA: Diagnosis not present

## 2019-01-19 DIAGNOSIS — M79675 Pain in left toe(s): Secondary | ICD-10-CM | POA: Diagnosis not present

## 2019-01-19 DIAGNOSIS — L600 Ingrowing nail: Secondary | ICD-10-CM | POA: Diagnosis not present

## 2019-01-19 DIAGNOSIS — M79674 Pain in right toe(s): Secondary | ICD-10-CM | POA: Diagnosis not present

## 2019-02-19 DIAGNOSIS — H6123 Impacted cerumen, bilateral: Secondary | ICD-10-CM | POA: Diagnosis not present

## 2019-03-10 DIAGNOSIS — Z1283 Encounter for screening for malignant neoplasm of skin: Secondary | ICD-10-CM | POA: Diagnosis not present

## 2019-03-10 DIAGNOSIS — L820 Inflamed seborrheic keratosis: Secondary | ICD-10-CM | POA: Diagnosis not present

## 2019-03-10 DIAGNOSIS — L905 Scar conditions and fibrosis of skin: Secondary | ICD-10-CM | POA: Diagnosis not present

## 2019-03-10 DIAGNOSIS — L821 Other seborrheic keratosis: Secondary | ICD-10-CM | POA: Diagnosis not present

## 2019-03-10 DIAGNOSIS — L570 Actinic keratosis: Secondary | ICD-10-CM | POA: Diagnosis not present

## 2019-03-10 DIAGNOSIS — L853 Xerosis cutis: Secondary | ICD-10-CM | POA: Diagnosis not present

## 2019-03-10 DIAGNOSIS — D692 Other nonthrombocytopenic purpura: Secondary | ICD-10-CM | POA: Diagnosis not present

## 2019-03-11 DIAGNOSIS — I495 Sick sinus syndrome: Secondary | ICD-10-CM | POA: Diagnosis not present

## 2019-04-09 DIAGNOSIS — I495 Sick sinus syndrome: Secondary | ICD-10-CM | POA: Diagnosis not present

## 2019-04-09 DIAGNOSIS — I420 Dilated cardiomyopathy: Secondary | ICD-10-CM | POA: Diagnosis not present

## 2019-04-09 DIAGNOSIS — I10 Essential (primary) hypertension: Secondary | ICD-10-CM | POA: Diagnosis not present

## 2019-04-09 DIAGNOSIS — E782 Mixed hyperlipidemia: Secondary | ICD-10-CM | POA: Diagnosis not present

## 2019-04-10 DIAGNOSIS — I10 Essential (primary) hypertension: Secondary | ICD-10-CM | POA: Diagnosis not present

## 2019-04-10 DIAGNOSIS — Z79899 Other long term (current) drug therapy: Secondary | ICD-10-CM | POA: Diagnosis not present

## 2019-04-10 DIAGNOSIS — E782 Mixed hyperlipidemia: Secondary | ICD-10-CM | POA: Diagnosis not present

## 2019-04-22 DIAGNOSIS — Z79899 Other long term (current) drug therapy: Secondary | ICD-10-CM | POA: Diagnosis not present

## 2019-04-22 DIAGNOSIS — I420 Dilated cardiomyopathy: Secondary | ICD-10-CM | POA: Diagnosis not present

## 2019-04-22 DIAGNOSIS — Z1239 Encounter for other screening for malignant neoplasm of breast: Secondary | ICD-10-CM | POA: Diagnosis not present

## 2019-04-22 DIAGNOSIS — E010 Iodine-deficiency related diffuse (endemic) goiter: Secondary | ICD-10-CM | POA: Diagnosis not present

## 2019-04-22 DIAGNOSIS — N183 Chronic kidney disease, stage 3 (moderate): Secondary | ICD-10-CM | POA: Diagnosis not present

## 2019-04-22 DIAGNOSIS — D649 Anemia, unspecified: Secondary | ICD-10-CM | POA: Diagnosis not present

## 2019-04-22 DIAGNOSIS — I10 Essential (primary) hypertension: Secondary | ICD-10-CM | POA: Diagnosis not present

## 2019-04-22 DIAGNOSIS — E782 Mixed hyperlipidemia: Secondary | ICD-10-CM | POA: Diagnosis not present

## 2019-04-22 DIAGNOSIS — Z1211 Encounter for screening for malignant neoplasm of colon: Secondary | ICD-10-CM | POA: Diagnosis not present

## 2019-04-22 DIAGNOSIS — Z0000 Encounter for general adult medical examination without abnormal findings: Secondary | ICD-10-CM | POA: Diagnosis not present

## 2019-04-28 DIAGNOSIS — Z1211 Encounter for screening for malignant neoplasm of colon: Secondary | ICD-10-CM | POA: Diagnosis not present

## 2019-06-23 DIAGNOSIS — I495 Sick sinus syndrome: Secondary | ICD-10-CM | POA: Diagnosis not present

## 2019-07-15 ENCOUNTER — Other Ambulatory Visit: Payer: Self-pay | Admitting: Internal Medicine

## 2019-07-15 DIAGNOSIS — Z1231 Encounter for screening mammogram for malignant neoplasm of breast: Secondary | ICD-10-CM

## 2019-07-31 DIAGNOSIS — E010 Iodine-deficiency related diffuse (endemic) goiter: Secondary | ICD-10-CM | POA: Diagnosis not present

## 2019-07-31 DIAGNOSIS — Z79899 Other long term (current) drug therapy: Secondary | ICD-10-CM | POA: Diagnosis not present

## 2019-07-31 DIAGNOSIS — I10 Essential (primary) hypertension: Secondary | ICD-10-CM | POA: Diagnosis not present

## 2019-08-04 DIAGNOSIS — H26493 Other secondary cataract, bilateral: Secondary | ICD-10-CM | POA: Diagnosis not present

## 2019-08-07 DIAGNOSIS — I420 Dilated cardiomyopathy: Secondary | ICD-10-CM | POA: Diagnosis not present

## 2019-08-07 DIAGNOSIS — N1831 Chronic kidney disease, stage 3a: Secondary | ICD-10-CM | POA: Diagnosis not present

## 2019-08-07 DIAGNOSIS — I10 Essential (primary) hypertension: Secondary | ICD-10-CM | POA: Diagnosis not present

## 2019-08-07 DIAGNOSIS — E782 Mixed hyperlipidemia: Secondary | ICD-10-CM | POA: Diagnosis not present

## 2019-08-07 DIAGNOSIS — D649 Anemia, unspecified: Secondary | ICD-10-CM | POA: Diagnosis not present

## 2019-08-07 DIAGNOSIS — E010 Iodine-deficiency related diffuse (endemic) goiter: Secondary | ICD-10-CM | POA: Diagnosis not present

## 2019-08-20 DIAGNOSIS — H6121 Impacted cerumen, right ear: Secondary | ICD-10-CM | POA: Diagnosis not present

## 2019-08-20 DIAGNOSIS — H903 Sensorineural hearing loss, bilateral: Secondary | ICD-10-CM | POA: Diagnosis not present

## 2019-08-25 ENCOUNTER — Ambulatory Visit
Admission: RE | Admit: 2019-08-25 | Discharge: 2019-08-25 | Disposition: A | Discharge status: Home / Self Care | Payer: PPO | Source: Ambulatory Visit | Attending: Internal Medicine | Admitting: Internal Medicine

## 2019-08-25 DIAGNOSIS — Z1231 Encounter for screening mammogram for malignant neoplasm of breast: Secondary | ICD-10-CM | POA: Insufficient documentation

## 2019-08-28 DIAGNOSIS — R109 Unspecified abdominal pain: Secondary | ICD-10-CM | POA: Diagnosis not present

## 2019-08-28 DIAGNOSIS — R1084 Generalized abdominal pain: Secondary | ICD-10-CM | POA: Diagnosis not present

## 2019-08-28 DIAGNOSIS — R112 Nausea with vomiting, unspecified: Secondary | ICD-10-CM | POA: Diagnosis not present

## 2019-08-28 DIAGNOSIS — K449 Diaphragmatic hernia without obstruction or gangrene: Secondary | ICD-10-CM | POA: Diagnosis not present

## 2019-08-29 ENCOUNTER — Emergency Department
Admission: EM | Admit: 2019-08-29 | Discharge: 2019-08-29 | Disposition: A | Discharge status: Home / Self Care | Payer: PPO | Attending: Emergency Medicine | Admitting: Emergency Medicine

## 2019-08-29 ENCOUNTER — Encounter: Payer: Self-pay | Admitting: Emergency Medicine

## 2019-08-29 ENCOUNTER — Other Ambulatory Visit: Payer: Self-pay

## 2019-08-29 ENCOUNTER — Emergency Department: Payer: PPO

## 2019-08-29 DIAGNOSIS — I10 Essential (primary) hypertension: Secondary | ICD-10-CM | POA: Insufficient documentation

## 2019-08-29 DIAGNOSIS — E860 Dehydration: Secondary | ICD-10-CM | POA: Insufficient documentation

## 2019-08-29 DIAGNOSIS — K5733 Diverticulitis of large intestine without perforation or abscess with bleeding: Secondary | ICD-10-CM | POA: Insufficient documentation

## 2019-08-29 DIAGNOSIS — Z79899 Other long term (current) drug therapy: Secondary | ICD-10-CM | POA: Insufficient documentation

## 2019-08-29 DIAGNOSIS — N289 Disorder of kidney and ureter, unspecified: Secondary | ICD-10-CM | POA: Diagnosis not present

## 2019-08-29 DIAGNOSIS — R1032 Left lower quadrant pain: Secondary | ICD-10-CM | POA: Diagnosis present

## 2019-08-29 DIAGNOSIS — Z7982 Long term (current) use of aspirin: Secondary | ICD-10-CM | POA: Diagnosis not present

## 2019-08-29 DIAGNOSIS — N179 Acute kidney failure, unspecified: Secondary | ICD-10-CM | POA: Diagnosis not present

## 2019-08-29 DIAGNOSIS — K5730 Diverticulosis of large intestine without perforation or abscess without bleeding: Secondary | ICD-10-CM | POA: Diagnosis not present

## 2019-08-29 LAB — COMPREHENSIVE METABOLIC PANEL
ALT: 11 U/L (ref 0–44)
AST: 20 U/L (ref 15–41)
Albumin: 3.9 g/dL (ref 3.5–5.0)
Alkaline Phosphatase: 51 U/L (ref 38–126)
Anion gap: 11 (ref 5–15)
BUN: 32 mg/dL — ABNORMAL HIGH (ref 8–23)
CO2: 22 mmol/L (ref 22–32)
Calcium: 9 mg/dL (ref 8.9–10.3)
Chloride: 99 mmol/L (ref 98–111)
Creatinine, Ser: 2.04 mg/dL — ABNORMAL HIGH (ref 0.44–1.00)
GFR calc Af Amer: 26 mL/min — ABNORMAL LOW (ref >60)
GFR calc non Af Amer: 22 mL/min — ABNORMAL LOW (ref >60)
Glucose, Bld: 119 mg/dL — ABNORMAL HIGH (ref 70–99)
Potassium: 4.4 mmol/L (ref 3.5–5.1)
Sodium: 132 mmol/L — ABNORMAL LOW (ref 135–145)
Total Bilirubin: 0.8 mg/dL (ref 0.3–1.2)
Total Protein: 7.1 g/dL (ref 6.5–8.1)

## 2019-08-29 LAB — CBC WITH DIFFERENTIAL/PLATELET
Abs Immature Granulocytes: 0.04 10*3/uL (ref 0.00–0.07)
Basophils Absolute: 0 10*3/uL (ref 0.0–0.1)
Basophils Relative: 0 %
Eosinophils Absolute: 0 10*3/uL (ref 0.0–0.5)
Eosinophils Relative: 0 %
HCT: 35.4 % — ABNORMAL LOW (ref 36.0–46.0)
Hemoglobin: 11.5 g/dL — ABNORMAL LOW (ref 12.0–15.0)
Immature Granulocytes: 0 %
Lymphocytes Relative: 13 %
Lymphs Abs: 1.4 10*3/uL (ref 0.7–4.0)
MCH: 29.4 pg (ref 26.0–34.0)
MCHC: 32.5 g/dL (ref 30.0–36.0)
MCV: 90.5 fL (ref 80.0–100.0)
Monocytes Absolute: 0.9 10*3/uL (ref 0.1–1.0)
Monocytes Relative: 8 %
Neutro Abs: 8.4 10*3/uL — ABNORMAL HIGH (ref 1.7–7.7)
Neutrophils Relative %: 79 %
Platelets: 187 10*3/uL (ref 150–400)
RBC: 3.91 MIL/uL (ref 3.87–5.11)
RDW: 13.1 % (ref 11.5–15.5)
WBC: 10.7 10*3/uL — ABNORMAL HIGH (ref 4.0–10.5)
nRBC: 0 % (ref 0.0–0.2)

## 2019-08-29 LAB — URINALYSIS, COMPLETE (UACMP) WITH MICROSCOPIC
Bacteria, UA: NONE SEEN
Bilirubin Urine: NEGATIVE
Glucose, UA: NEGATIVE mg/dL
Hgb urine dipstick: NEGATIVE
Ketones, ur: NEGATIVE mg/dL
Leukocytes,Ua: NEGATIVE
Nitrite: NEGATIVE
Protein, ur: NEGATIVE mg/dL
Specific Gravity, Urine: 1.005 (ref 1.005–1.030)
pH: 5 (ref 5.0–8.0)

## 2019-08-29 LAB — LIPASE, BLOOD: Lipase: 34 U/L (ref 11–51)

## 2019-08-29 LAB — PROTIME-INR
INR: 1.1 (ref 0.8–1.2)
Prothrombin Time: 13.7 seconds (ref 11.4–15.2)

## 2019-08-29 MED ORDER — FENTANYL CITRATE (PF) 100 MCG/2ML IJ SOLN
50.0000 ug | Freq: Once | INTRAMUSCULAR | Status: DC
  Filled 2019-08-29: qty 2

## 2019-08-29 MED ORDER — SODIUM CHLORIDE 0.9 % IV BOLUS
500.0000 mL | Freq: Once | INTRAVENOUS | Status: AC
  Administered 2019-08-29: 500 mL via INTRAVENOUS

## 2019-08-29 MED ORDER — FAMOTIDINE 20 MG PO TABS: 20.0000 mg | ORAL_TABLET | Freq: Two times a day (BID) | ORAL | 0 refills | Status: DC

## 2019-08-29 MED ORDER — ONDANSETRON 4 MG PO TBDP: 4.0000 mg | ORAL_TABLET | Freq: Three times a day (TID) | ORAL | 0 refills | Status: DC | PRN

## 2019-08-29 NOTE — ED Provider Notes (Signed)
[facility] Emergency Department Provider Note  ____________________________________________  Time seen: Approximately 4:02 AM  I have reviewed the triage vital signs and the nursing notes.   HISTORY  Chief Complaint Abdominal Pain    HPI Michele Montgomery is a 78 y.o. female with a history of colitis anemia hypertension diverticulitis who comes the ED complaining of worsening left lower quadrant abdominal pain started yesterday.  Associated with a loss of appetite.  Nonradiating.  Worse lying down.  No alleviating factors.  Saw primary care yesterday who was suspicious for diverticulitis and prescribed her Avelox which she has not started yet.  Patient does report some nausea and vomiting over the last 24 hours, and today passed a clot of blood rectally with a bowel movement.  Past Medical History:  Diagnosis Date  . AICD (automatic cardioverter/defibrillator) present    2011  . Anemia   . Arthritis    osteo  . Colitis   . Depression   . Dilated cardiomyopathy ([place])   . Diverticulitis   . Dysrhythmia   . Enlarged heart   . Fracture    LEFT ANKLE/ WEARING BRACE  . GERD (gastroesophageal reflux disease)   . Hearing aid worn   . Hyperlipidemia   . Hypertension   . Osteoporosis   . Ovarian cyst   . Spinal stenosis   . Thyromegaly   . Venous stasis      There are no active problems to display for this patient.    Past Surgical History:  Procedure Laterality Date  . CARDIAC DEFIBRILLATOR PLACEMENT    . CARDIAC DEFIBRILLATOR PLACEMENT    . CATARACT EXTRACTION W/PHACO Left 03/05/2016   Procedure: CATARACT EXTRACTION PHACO AND INTRAOCULAR LENS PLACEMENT (IOC);  Surgeon: Estill Cotta, MD;  Location: [facility] ORS;  Service: Ophthalmology;  Laterality: Left;  [place] 00:58 AP% 23.9 CDE 24.51 fluid pack lot # [id number]  . CATARACT EXTRACTION W/PHACO Right 07/25/2016   Procedure: CATARACT EXTRACTION PHACO AND INTRAOCULAR LENS PLACEMENT ([place]);  Surgeon:  Estill Cotta, MD;  Location: [facility] ORS;  Service: Ophthalmology;  Laterality: Right;  [place] 1.05 AP% 20.4 CDE 21.59 fluid pack lot # [id number]  . COLONOSCOPY WITH PROPOFOL N/A 08/01/2015   Procedure: COLONOSCOPY WITH PROPOFOL;  Surgeon: Josefine Class, MD;  Location: [facility] ENDOSCOPY;  Service: Endoscopy;  Laterality: N/A;  . ESOPHAGOGASTRODUODENOSCOPY (EGD) WITH PROPOFOL N/A 08/01/2015   Procedure: ESOPHAGOGASTRODUODENOSCOPY (EGD) WITH PROPOFOL;  Surgeon: Josefine Class, MD;  Location: [facility] ENDOSCOPY;  Service: Endoscopy;  Laterality: N/A;  . IMPLANTABLE CARDIOVERTER DEFIBRILLATOR (ICD) GENERATOR CHANGE Left 01/09/2017   Procedure: permanent biventricular pacemaker and implantable cardioverter defibrillator;  Surgeon: Isaias Cowman, MD;  Location: [facility] ORS;  Service: Cardiovascular;  Laterality: Left;  . PACEMAKER PLACEMENT       Prior to Admission medications   Medication Sig Start Date End Date Taking? Authorizing Provider  aspirin 81 MG tablet Take 1 tablet by mouth at bedtime.  11/25/09   [provider]  carvedilol (COREG) 25 MG tablet Take 25 mg by mouth 2 (two) times daily.  04/02/15   [provider]  cetirizine (ZYRTEC) 10 MG tablet Take 10 mg by mouth at bedtime.     [provider]  Cholecalciferol (VITAMIN D3) 5000 units CAPS Take 5,000 Units by mouth daily.    [provider]  citalopram (CELEXA) 20 MG tablet Take 20 mg by mouth at bedtime.     [provider]  clarithromycin (BIAXIN) 250 MG tablet Take 1 tablet (250  mg total) by mouth 2 (two) times daily. 01/09/17   Paraschos, Alexander, MD  clarithromycin (BIAXIN) 250 MG tablet Take 1 tablet (250 mg total) by mouth 2 (two) times daily. 01/09/17   Paraschos, Alexander, MD  cyclobenzaprine (FLEXERIL) 5 MG tablet Take 5 mg by mouth 2 (two) times daily as needed for muscle spasms. 06/19/16   [provider]  famotidine (PEPCID) 20 MG tablet Take 1 tablet (20 mg total) by  mouth 2 (two) times daily. 08/29/19   Carrie Mew, MD  IBU 800 MG tablet Take 800 mg by mouth 2 (two) times daily as needed for pain. 12/13/16   [provider]  LORazepam (ATIVAN) 0.5 MG tablet Take 1 mg by mouth at bedtime.  06/09/15   [provider]  ondansetron (ZOFRAN ODT) 4 MG disintegrating tablet Take 1 tablet (4 mg total) by mouth every 8 (eight) hours as needed for nausea or vomiting. 08/29/19   Carrie Mew, MD  pantoprazole (PROTONIX) 40 MG tablet Take 1 tablet by mouth daily. 03/29/15   [provider]  Probiotic Product (ALIGN PO) Take 1 tablet by mouth at bedtime.    [provider]  ramipril (ALTACE) 10 MG capsule Take 10 mg by mouth daily.  03/29/15   [provider]  simvastatin (ZOCOR) 40 MG tablet Take 40 mg by mouth at bedtime.  04/03/15   [provider]  vitamin B-12 (CYANOCOBALAMIN) 1000 MCG tablet Take 1,000 mcg by mouth daily.    [provider]     Allergies Penicillins, Ciprofloxacin, Hydrochlorothiazide, Metronidazole, Other, and Prednisone   Family History  Problem Relation Age of Onset  . Breast cancer Neg Hx     Social History Social History   Tobacco Use  . Smoking status: Never Smoker  . Smokeless tobacco: Never Used  Substance Use Topics  . Alcohol use: No  . Drug use: No    Review of Systems  Constitutional:   No fever or chills.  ENT:   No sore throat. No rhinorrhea. Cardiovascular:   No chest pain or syncope. Respiratory:   No dyspnea or cough. Gastrointestinal: Positive as above for abdominal pain and vomiting. Musculoskeletal:   Negative for focal pain or swelling All other systems reviewed and are negative except as documented above in ROS and HPI.  ____________________________________________   PHYSICAL EXAM:  VITAL SIGNS: ED Triage Vitals  Enc Vitals Group     BP 08/29/19 0134 (!) 106/45     Pulse Rate 08/29/19 0134 71     Resp 08/29/19 0134 15     Temp  08/29/19 0134 98 F (36.7 C)     Temp Source 08/29/19 0134 Oral     SpO2 08/29/19 0134 94 %     Weight 08/29/19 0133 130 lb (59 kg)     Height 08/29/19 0133 5' (1.524 m)     Head Circumference --      Peak Flow --      Pain Score 08/29/19 0132 8     Pain Loc --      Pain Edu? --      Excl. in [place]? --     Vital signs reviewed, nursing assessments reviewed.   Constitutional:   Alert and oriented. Non-toxic appearance. Eyes:   Conjunctivae are normal. EOMI. PERRL. ENT      Head:   Normocephalic and atraumatic.      Nose:   Wearing a mask.      Mouth/Throat:   Wearing a mask.  Neck:   No meningismus. Full ROM. Hematological/Lymphatic/Immunilogical:   No cervical lymphadenopathy. Cardiovascular:   RRR. Symmetric bilateral radial and DP pulses.  No murmurs. Cap refill less than 2 seconds. Respiratory:   Normal respiratory effort without tachypnea/retractions. Breath sounds are clear and equal bilaterally. No wheezes/rales/rhonchi. Gastrointestinal:   Soft with left lower quadrant and suprapubic tenderness. Non distended. There is no CVA tenderness.  No rebound, rigidity, or guarding. Musculoskeletal:   Normal range of motion in all extremities. No joint effusions.  No lower extremity tenderness.  No edema. Neurologic:   Normal speech and language.  Motor grossly intact. No acute focal neurologic deficits are appreciated.  Skin:    Skin is warm, dry and intact. No rash noted.  No petechiae, purpura, or bullae.  ____________________________________________    LABS (pertinent positives/negatives) (all labs ordered are listed, but only abnormal results are displayed) Labs Reviewed  CBC WITH DIFFERENTIAL/PLATELET - Abnormal; Notable for the following components:      Result Value   WBC 10.7 (*)    Hemoglobin 11.5 (*)    HCT 35.4 (*)    Neutro Abs 8.4 (*)    All other components within normal limits  COMPREHENSIVE METABOLIC PANEL - Abnormal; Notable for the following  components:   Sodium 132 (*)    Glucose, Bld 119 (*)    BUN 32 (*)    Creatinine, Ser 2.04 (*)    GFR calc non Af Amer 22 (*)    GFR calc Af Amer 26 (*)    All other components within normal limits  URINALYSIS, COMPLETE (UACMP) WITH MICROSCOPIC - Abnormal; Notable for the following components:   Color, Urine STRAW (*)    APPearance CLEAR (*)    All other components within normal limits  URINE CULTURE  PROTIME-INR  LIPASE, BLOOD   ____________________________________________   EKG  Interpreted by me Ventricular paced rhythm, rate of 69.  Left axis, no acute ischemic changes  ____________________________________________    RADIOLOGY  Ct Abdomen Pelvis Wo Contrast  Result Date: 08/29/2019 CLINICAL DATA:  Abdominal pain with nausea and vomiting EXAM: CT ABDOMEN AND PELVIS WITHOUT CONTRAST TECHNIQUE: Multidetector CT imaging of the abdomen and pelvis was performed following the standard protocol without IV contrast. COMPARISON:  None. FINDINGS: LOWER CHEST: No basilar pleural or apical pericardial effusion. HEPATOBILIARY: Normal hepatic contours. There is no intra- or extrahepatic biliary dilatation. The gallbladder is normal. PANCREAS: Normal pancreatic contours without pancreatic ductal dilatation or peripancreatic fluid collection. SPLEEN: Normal. ADRENALS/URINARY TRACT: --Adrenal glands: Normal. --Right kidney/ureter: No hydronephrosis, nephroureterolithiasis or solid renal mass. --Left kidney/ureter: No hydronephrosis, nephroureterolithiasis or solid renal mass. --Urinary bladder: Normal for degree of distention STOMACH/BOWEL: --Stomach/Duodenum: There is no hiatal hernia. The duodenal course and caliber are normal. --Small bowel: No dilatation or inflammation. --Colon: There is wall thickening of the distal descending colon with mild surrounding inflammatory stranding. There is descending colonic and sigmoid diverticulosis. --Appendix: Normal. VASCULAR/LYMPHATIC: There is mild  calcific atherosclerosis of the abdominal aorta. No abdominal or pelvic lymphadenopathy. REPRODUCTIVE: Normal uterus and ovaries. MUSCULOSKELETAL. Multilevel degenerative disc disease and facet arthrosis. No bony spinal canal stenosis. There is left convex scoliosis. OTHER: None. IMPRESSION: Wall thickening and mild surrounding inflammatory stranding of the distal descending colon, consistent with acute diverticulitis. No abscess or free intraperitoneal air. Electronically Signed   By: Ulyses Jarred M.D.   On: 08/29/2019 04:45    ____________________________________________   PROCEDURES Procedures  ____________________________________________  DIFFERENTIAL DIAGNOSIS   Diverticulitis, appendicitis, bowel obstruction, perforation, intra-abdominal  abscess, cystitis  CLINICAL IMPRESSION / ASSESSMENT AND PLAN / ED COURSE  Medications ordered in the ED: Medications  fentaNYL (SUBLIMAZE) injection 50 mcg (0 mcg Intravenous Hold 08/29/19 0433)  sodium chloride 0.9 % bolus 500 mL (0 mLs Intravenous Stopped 08/29/19 0430)  sodium chloride 0.9 % bolus 500 mL (0 mLs Intravenous Stopped 08/29/19 0510)    Pertinent labs & imaging results that were available during my care of the patient were reviewed by me and considered in my medical decision making (see chart for details).  ANALIYA SATO was evaluated in Emergency Department on 08/29/2019 for the symptoms described in the history of present illness. She was evaluated in the context of the global COVID-19 pandemic, which necessitated consideration that the patient might be at risk for infection with the SARS-CoV-2 virus that causes COVID-19. Institutional protocols and algorithms that pertain to the evaluation of patients at risk for COVID-19 are in a state of rapid change based on information released by regulatory bodies including the CDC and federal and state organizations. These policies and algorithms were followed during the patient's care in  the ED.   Patient presents with lower abdominal pain and tenderness since yesterday, associated loss of appetite.  Vital signs are unremarkable.  She states that she is feeling better after receiving 500 mL of fluid bolus already in the ED, but still has pain.  I will give her fentanyl 50 mcg IV and an additional 500 mL of saline IV while getting a CT scan to further evaluate for complicated diverticulitis primarily.  Initial lab panel shows unremarkable CBC.  Chemistry panel significant for a creatinine of 2.0, improved from 2.2 yesterday but elevated compared to her chronic baseline of about 1.1.   ----------------------------------------- 6:17 AM on 08/29/2019 -----------------------------------------  CT shows uncomplicated diverticulitis.  Patient reports feeling better after IV fluids.  Vital signs are normal, labs are normal and reassuring, patient is nontoxic.  Advised her to take her antibiotics as prescribed by her doctor.  I will add a prescription for Zofran so that she can maintain better hydration, follow-up with primary care.     ____________________________________________   FINAL CLINICAL IMPRESSION(S) / ED DIAGNOSES    Final diagnoses:  Left lower quadrant abdominal pain  Diverticulitis of large intestine without perforation or abscess with bleeding  Acute renal insufficiency  Dehydration     ED Discharge Orders         Ordered    famotidine (PEPCID) 20 MG tablet  2 times daily,   Status:  Discontinued     08/29/19 0616    ondansetron (ZOFRAN ODT) 4 MG disintegrating tablet  Every 8 hours PRN,   Status:  Discontinued     08/29/19 0616    famotidine (PEPCID) 20 MG tablet  2 times daily     08/29/19 0617    ondansetron (ZOFRAN ODT) 4 MG disintegrating tablet  Every 8 hours PRN     08/29/19 0617          Portions of this note were generated with dragon dictation software. Dictation errors may occur despite best attempts at proofreading.   Carrie Mew,  MD 08/29/19 [phone]

## 2019-08-29 NOTE — ED Triage Notes (Signed)
Pt brought in POV for nausea and abd pain on her left lower side that started today . States she was seen by Dr. Marlin Canary and evaluated for her abd pain on 11/13 via x-ray. Per pt's daughter :"Dr, Marlin Canary saw some infection on the x-ray and thought it may have been her appendix and gave her an antibiotic to take but she hasn't been able to eat or drink much in the last 2 days". Pt states shes been feeling this pain for a few weeks "hoping it would go away". Pt sx : N/V/D/ loss of appetite. Pt st she passed "a big clot of blood from her rectum".

## 2019-08-29 NOTE — ED Notes (Signed)
E-signature not working at this time. Pt verbalized understanding of D/C instructions, prescriptions and follow up care with no further questions at this time. Pt in NAD and ambulatory at time of D/C.  

## 2019-08-30 LAB — URINE CULTURE: Culture: NO GROWTH

## 2019-08-31 DIAGNOSIS — K5792 Diverticulitis of intestine, part unspecified, without perforation or abscess without bleeding: Secondary | ICD-10-CM | POA: Diagnosis not present

## 2019-08-31 DIAGNOSIS — R112 Nausea with vomiting, unspecified: Secondary | ICD-10-CM | POA: Diagnosis not present

## 2019-08-31 DIAGNOSIS — R1084 Generalized abdominal pain: Secondary | ICD-10-CM | POA: Diagnosis not present

## 2019-08-31 DIAGNOSIS — I10 Essential (primary) hypertension: Secondary | ICD-10-CM | POA: Diagnosis not present

## 2019-09-03 DIAGNOSIS — K5792 Diverticulitis of intestine, part unspecified, without perforation or abscess without bleeding: Secondary | ICD-10-CM | POA: Diagnosis not present

## 2019-09-03 DIAGNOSIS — R1084 Generalized abdominal pain: Secondary | ICD-10-CM | POA: Diagnosis not present

## 2019-09-15 DIAGNOSIS — K219 Gastro-esophageal reflux disease without esophagitis: Secondary | ICD-10-CM | POA: Diagnosis not present

## 2019-09-15 DIAGNOSIS — R1031 Right lower quadrant pain: Secondary | ICD-10-CM | POA: Diagnosis not present

## 2019-09-15 DIAGNOSIS — R1032 Left lower quadrant pain: Secondary | ICD-10-CM | POA: Diagnosis not present

## 2019-09-18 DIAGNOSIS — K219 Gastro-esophageal reflux disease without esophagitis: Secondary | ICD-10-CM | POA: Diagnosis not present

## 2019-09-18 DIAGNOSIS — K449 Diaphragmatic hernia without obstruction or gangrene: Secondary | ICD-10-CM | POA: Diagnosis not present

## 2019-09-18 DIAGNOSIS — Z8719 Personal history of other diseases of the digestive system: Secondary | ICD-10-CM | POA: Diagnosis not present

## 2019-09-22 DIAGNOSIS — I495 Sick sinus syndrome: Secondary | ICD-10-CM | POA: Diagnosis not present

## 2019-10-13 DIAGNOSIS — E782 Mixed hyperlipidemia: Secondary | ICD-10-CM | POA: Diagnosis not present

## 2019-10-13 DIAGNOSIS — I495 Sick sinus syndrome: Secondary | ICD-10-CM | POA: Diagnosis not present

## 2019-10-13 DIAGNOSIS — I10 Essential (primary) hypertension: Secondary | ICD-10-CM | POA: Diagnosis not present

## 2019-10-13 DIAGNOSIS — Z9889 Other specified postprocedural states: Secondary | ICD-10-CM | POA: Diagnosis not present

## 2019-10-13 DIAGNOSIS — I420 Dilated cardiomyopathy: Secondary | ICD-10-CM | POA: Diagnosis not present

## 2019-11-09 DIAGNOSIS — I10 Essential (primary) hypertension: Secondary | ICD-10-CM | POA: Diagnosis not present

## 2019-11-09 DIAGNOSIS — E782 Mixed hyperlipidemia: Secondary | ICD-10-CM | POA: Diagnosis not present

## 2019-11-09 DIAGNOSIS — N1831 Chronic kidney disease, stage 3a: Secondary | ICD-10-CM | POA: Diagnosis not present

## 2019-11-09 DIAGNOSIS — E010 Iodine-deficiency related diffuse (endemic) goiter: Secondary | ICD-10-CM | POA: Diagnosis not present

## 2019-11-09 DIAGNOSIS — D649 Anemia, unspecified: Secondary | ICD-10-CM | POA: Diagnosis not present

## 2019-11-09 DIAGNOSIS — Z79899 Other long term (current) drug therapy: Secondary | ICD-10-CM | POA: Diagnosis not present

## 2019-12-29 DIAGNOSIS — I495 Sick sinus syndrome: Secondary | ICD-10-CM | POA: Diagnosis not present

## 2020-01-19 DIAGNOSIS — J301 Allergic rhinitis due to pollen: Secondary | ICD-10-CM | POA: Diagnosis not present

## 2020-01-19 DIAGNOSIS — J340 Abscess, furuncle and carbuncle of nose: Secondary | ICD-10-CM | POA: Diagnosis not present

## 2020-01-19 DIAGNOSIS — H6123 Impacted cerumen, bilateral: Secondary | ICD-10-CM | POA: Diagnosis not present

## 2020-01-19 DIAGNOSIS — R040 Epistaxis: Secondary | ICD-10-CM | POA: Diagnosis not present

## 2020-02-01 DIAGNOSIS — N1831 Chronic kidney disease, stage 3a: Secondary | ICD-10-CM | POA: Diagnosis not present

## 2020-02-01 DIAGNOSIS — E782 Mixed hyperlipidemia: Secondary | ICD-10-CM | POA: Diagnosis not present

## 2020-02-01 DIAGNOSIS — Z79899 Other long term (current) drug therapy: Secondary | ICD-10-CM | POA: Diagnosis not present

## 2020-02-01 DIAGNOSIS — D649 Anemia, unspecified: Secondary | ICD-10-CM | POA: Diagnosis not present

## 2020-02-01 DIAGNOSIS — I420 Dilated cardiomyopathy: Secondary | ICD-10-CM | POA: Diagnosis not present

## 2020-02-01 DIAGNOSIS — Z0000 Encounter for general adult medical examination without abnormal findings: Secondary | ICD-10-CM | POA: Diagnosis not present

## 2020-02-01 DIAGNOSIS — I10 Essential (primary) hypertension: Secondary | ICD-10-CM | POA: Diagnosis not present

## 2020-02-01 DIAGNOSIS — R0602 Shortness of breath: Secondary | ICD-10-CM | POA: Diagnosis not present

## 2020-02-02 ENCOUNTER — Other Ambulatory Visit: Payer: Self-pay | Admitting: Internal Medicine

## 2020-02-02 DIAGNOSIS — N179 Acute kidney failure, unspecified: Secondary | ICD-10-CM

## 2020-02-05 DIAGNOSIS — I420 Dilated cardiomyopathy: Secondary | ICD-10-CM | POA: Diagnosis not present

## 2020-02-05 DIAGNOSIS — R0602 Shortness of breath: Secondary | ICD-10-CM | POA: Diagnosis not present

## 2020-02-09 ENCOUNTER — Other Ambulatory Visit: Payer: Self-pay

## 2020-02-09 ENCOUNTER — Ambulatory Visit
Admission: RE | Admit: 2020-02-09 | Discharge: 2020-02-09 | Disposition: A | Discharge status: Home / Self Care | Payer: PPO | Source: Ambulatory Visit | Attending: Internal Medicine | Admitting: Internal Medicine

## 2020-02-09 DIAGNOSIS — N179 Acute kidney failure, unspecified: Secondary | ICD-10-CM | POA: Diagnosis not present

## 2020-02-15 DIAGNOSIS — N179 Acute kidney failure, unspecified: Secondary | ICD-10-CM | POA: Diagnosis not present

## 2020-02-22 DIAGNOSIS — H26493 Other secondary cataract, bilateral: Secondary | ICD-10-CM | POA: Diagnosis not present

## 2020-03-15 ENCOUNTER — Other Ambulatory Visit: Payer: Self-pay

## 2020-03-15 ENCOUNTER — Ambulatory Visit: Payer: PPO | Admitting: Dermatology

## 2020-03-15 DIAGNOSIS — I781 Nevus, non-neoplastic: Secondary | ICD-10-CM | POA: Diagnosis not present

## 2020-03-15 DIAGNOSIS — Z1283 Encounter for screening for malignant neoplasm of skin: Secondary | ICD-10-CM | POA: Diagnosis not present

## 2020-03-15 DIAGNOSIS — L219 Seborrheic dermatitis, unspecified: Secondary | ICD-10-CM | POA: Diagnosis not present

## 2020-03-15 DIAGNOSIS — D692 Other nonthrombocytopenic purpura: Secondary | ICD-10-CM

## 2020-03-15 DIAGNOSIS — D229 Melanocytic nevi, unspecified: Secondary | ICD-10-CM | POA: Diagnosis not present

## 2020-03-15 DIAGNOSIS — S80811A Abrasion, right lower leg, initial encounter: Secondary | ICD-10-CM

## 2020-03-15 DIAGNOSIS — D1801 Hemangioma of skin and subcutaneous tissue: Secondary | ICD-10-CM | POA: Diagnosis not present

## 2020-03-15 DIAGNOSIS — L814 Other melanin hyperpigmentation: Secondary | ICD-10-CM

## 2020-03-15 DIAGNOSIS — L578 Other skin changes due to chronic exposure to nonionizing radiation: Secondary | ICD-10-CM

## 2020-03-15 DIAGNOSIS — L821 Other seborrheic keratosis: Secondary | ICD-10-CM | POA: Diagnosis not present

## 2020-03-15 NOTE — Patient Instructions (Signed)
Recommend daily broad spectrum sunscreen SPF 30+ to sun-exposed areas, reapply every 2 hours as needed. Call for new or changing lesions.  

## 2020-03-15 NOTE — Progress Notes (Signed)
   Follow-Up Visit   Subjective  Michele Montgomery is a 79 y.o. female who presents for the following: TBSE (No concerns today).   The following portions of the chart were reviewed this encounter and updated as appropriate:      Review of Systems:  No other skin or systemic complaints except as noted in HPI or Assessment and Plan.  Objective  Well appearing patient in no apparent distress; mood and affect are within normal limits.  A full examination was performed including scalp, head, eyes, ears, nose, lips, neck, chest, axillae, abdomen, back, buttocks, bilateral upper extremities, bilateral lower extremities, hands, feet, fingers, toes, fingernails, and toenails. All findings within normal limits unless otherwise noted below.  Objective  Right Lower Leg - Posterior: Pink crusted excoriation from cat scratch, no evidence of infection.  Objective  Bilateral Eyelid: Mild xerosis  Objective  Bilateral Legs: Stuck-on, waxy, tan-brown papules and plaques -- Discussed benign etiology and prognosis.    Assessment & Plan  Excoriation of right lower leg, initial encounter Right Lower Leg - Posterior  Benign-appearing.  Observation.  Recommend antibiotic ointment daily until healed  Seborrheic dermatitis Bilateral Eyelid  Mild Moisturizer daily  OTC hydrocortisone 1% cream bid prn itch  Seborrheic keratosis Bilateral Legs  Benign, observe.      Lentigines - Scattered tan macules - Discussed due to sun exposure - Benign, observe - Call for any changes  Telangiectasia - Dilated blood vessel - Benign appearing on exam - Call for changes  Melanocytic Nevi - Tan-brown and/or pink-flesh-colored symmetric macules and papules - Benign appearing on exam today - Observation - Call clinic for new or changing moles - Recommend daily use of broad spectrum spf 30+ sunscreen to sun-exposed areas.   Hemangiomas - Red papules - Discussed benign nature - Observe - Call  for any changes  Actinic Damage - diffuse scaly erythematous macules with underlying dyspigmentation - Recommend daily broad spectrum sunscreen SPF 30+ to sun-exposed areas, reapply every 2 hours as needed.  - Call for new or changing lesions.  Purpura - Violaceous macules and patches - Benign - Related to age, sun damage and/or use of blood thinners - Observe - Can use OTC arnica containing moisturizer such as Dermend Bruise Formula if desired - Call for worsening or other concerns  Actinic Damage - diffuse scaly erythematous macules with underlying dyspigmentation - Recommend daily broad spectrum sunscreen SPF 30+ to sun-exposed areas, reapply every 2 hours as needed.  - Call for new or changing lesions.   Skin cancer screening performed today.   Return in about 1 year (around 03/15/2021) for TBSE. I, Donzetta Kohut, CMA, am acting as scribe for [name], MD .  Documentation: I have reviewed the above documentation for accuracy and completeness, and I agree with the above.  [name] MD

## 2020-03-29 DIAGNOSIS — I495 Sick sinus syndrome: Secondary | ICD-10-CM | POA: Diagnosis not present

## 2020-04-11 DIAGNOSIS — E782 Mixed hyperlipidemia: Secondary | ICD-10-CM | POA: Diagnosis not present

## 2020-04-11 DIAGNOSIS — I495 Sick sinus syndrome: Secondary | ICD-10-CM | POA: Diagnosis not present

## 2020-04-11 DIAGNOSIS — I420 Dilated cardiomyopathy: Secondary | ICD-10-CM | POA: Diagnosis not present

## 2020-04-11 DIAGNOSIS — R0602 Shortness of breath: Secondary | ICD-10-CM | POA: Diagnosis not present

## 2020-04-11 DIAGNOSIS — I10 Essential (primary) hypertension: Secondary | ICD-10-CM | POA: Diagnosis not present

## 2020-04-15 DIAGNOSIS — K449 Diaphragmatic hernia without obstruction or gangrene: Secondary | ICD-10-CM | POA: Diagnosis not present

## 2020-04-15 DIAGNOSIS — I878 Other specified disorders of veins: Secondary | ICD-10-CM | POA: Diagnosis not present

## 2020-04-15 DIAGNOSIS — R1084 Generalized abdominal pain: Secondary | ICD-10-CM | POA: Diagnosis not present

## 2020-04-15 DIAGNOSIS — R0781 Pleurodynia: Secondary | ICD-10-CM | POA: Diagnosis not present

## 2020-04-15 DIAGNOSIS — M419 Scoliosis, unspecified: Secondary | ICD-10-CM | POA: Diagnosis not present

## 2020-04-22 DIAGNOSIS — N1831 Chronic kidney disease, stage 3a: Secondary | ICD-10-CM | POA: Diagnosis not present

## 2020-05-02 ENCOUNTER — Other Ambulatory Visit: Payer: Self-pay | Admitting: Internal Medicine

## 2020-05-02 DIAGNOSIS — D649 Anemia, unspecified: Secondary | ICD-10-CM | POA: Diagnosis not present

## 2020-05-02 DIAGNOSIS — Z0000 Encounter for general adult medical examination without abnormal findings: Secondary | ICD-10-CM | POA: Diagnosis not present

## 2020-05-02 DIAGNOSIS — Z79899 Other long term (current) drug therapy: Secondary | ICD-10-CM | POA: Diagnosis not present

## 2020-05-02 DIAGNOSIS — E538 Deficiency of other specified B group vitamins: Secondary | ICD-10-CM | POA: Diagnosis not present

## 2020-05-02 DIAGNOSIS — Z1211 Encounter for screening for malignant neoplasm of colon: Secondary | ICD-10-CM | POA: Diagnosis not present

## 2020-05-02 DIAGNOSIS — Z1231 Encounter for screening mammogram for malignant neoplasm of breast: Secondary | ICD-10-CM | POA: Diagnosis not present

## 2020-05-02 DIAGNOSIS — E782 Mixed hyperlipidemia: Secondary | ICD-10-CM | POA: Diagnosis not present

## 2020-05-02 DIAGNOSIS — E010 Iodine-deficiency related diffuse (endemic) goiter: Secondary | ICD-10-CM | POA: Diagnosis not present

## 2020-05-02 DIAGNOSIS — N1831 Chronic kidney disease, stage 3a: Secondary | ICD-10-CM | POA: Diagnosis not present

## 2020-05-02 DIAGNOSIS — I420 Dilated cardiomyopathy: Secondary | ICD-10-CM | POA: Diagnosis not present

## 2020-05-02 DIAGNOSIS — I10 Essential (primary) hypertension: Secondary | ICD-10-CM | POA: Diagnosis not present

## 2020-05-10 DIAGNOSIS — Z1211 Encounter for screening for malignant neoplasm of colon: Secondary | ICD-10-CM | POA: Diagnosis not present

## 2020-06-22 DIAGNOSIS — I10 Essential (primary) hypertension: Secondary | ICD-10-CM | POA: Diagnosis not present

## 2020-06-22 DIAGNOSIS — F4323 Adjustment disorder with mixed anxiety and depressed mood: Secondary | ICD-10-CM | POA: Diagnosis not present

## 2020-06-22 DIAGNOSIS — R5383 Other fatigue: Secondary | ICD-10-CM | POA: Diagnosis not present

## 2020-06-22 DIAGNOSIS — Z23 Encounter for immunization: Secondary | ICD-10-CM | POA: Diagnosis not present

## 2020-06-28 DIAGNOSIS — I495 Sick sinus syndrome: Secondary | ICD-10-CM | POA: Diagnosis not present

## 2020-07-08 DIAGNOSIS — Z79899 Other long term (current) drug therapy: Secondary | ICD-10-CM | POA: Diagnosis not present

## 2020-07-08 DIAGNOSIS — E782 Mixed hyperlipidemia: Secondary | ICD-10-CM | POA: Diagnosis not present

## 2020-07-08 DIAGNOSIS — I10 Essential (primary) hypertension: Secondary | ICD-10-CM | POA: Diagnosis not present

## 2020-07-15 ENCOUNTER — Other Ambulatory Visit: Payer: Self-pay | Admitting: Internal Medicine

## 2020-07-15 DIAGNOSIS — Z79899 Other long term (current) drug therapy: Secondary | ICD-10-CM | POA: Diagnosis not present

## 2020-07-15 DIAGNOSIS — N1831 Chronic kidney disease, stage 3a: Secondary | ICD-10-CM | POA: Diagnosis not present

## 2020-07-15 DIAGNOSIS — R1084 Generalized abdominal pain: Secondary | ICD-10-CM | POA: Diagnosis not present

## 2020-07-15 DIAGNOSIS — Z1231 Encounter for screening mammogram for malignant neoplasm of breast: Secondary | ICD-10-CM | POA: Diagnosis not present

## 2020-07-15 DIAGNOSIS — I10 Essential (primary) hypertension: Secondary | ICD-10-CM

## 2020-07-15 DIAGNOSIS — I420 Dilated cardiomyopathy: Secondary | ICD-10-CM | POA: Diagnosis not present

## 2020-07-15 DIAGNOSIS — E782 Mixed hyperlipidemia: Secondary | ICD-10-CM | POA: Diagnosis not present

## 2020-07-15 DIAGNOSIS — D649 Anemia, unspecified: Secondary | ICD-10-CM | POA: Diagnosis not present

## 2020-07-22 ENCOUNTER — Other Ambulatory Visit: Payer: Self-pay

## 2020-07-22 ENCOUNTER — Ambulatory Visit
Admission: RE | Admit: 2020-07-22 | Discharge: 2020-07-22 | Disposition: A | Discharge status: Home / Self Care | Payer: PPO | Source: Ambulatory Visit | Attending: Internal Medicine | Admitting: Internal Medicine

## 2020-07-22 DIAGNOSIS — R1084 Generalized abdominal pain: Secondary | ICD-10-CM | POA: Insufficient documentation

## 2020-07-22 DIAGNOSIS — I10 Essential (primary) hypertension: Secondary | ICD-10-CM

## 2020-08-03 DIAGNOSIS — S81812A Laceration without foreign body, left lower leg, initial encounter: Secondary | ICD-10-CM | POA: Diagnosis not present

## 2020-08-03 DIAGNOSIS — W010XXA Fall on same level from slipping, tripping and stumbling without subsequent striking against object, initial encounter: Secondary | ICD-10-CM | POA: Diagnosis not present

## 2020-08-08 DIAGNOSIS — Z961 Presence of intraocular lens: Secondary | ICD-10-CM | POA: Diagnosis not present

## 2020-08-15 ENCOUNTER — Other Ambulatory Visit: Payer: Self-pay

## 2020-08-15 ENCOUNTER — Ambulatory Visit: Payer: PPO | Admitting: Dermatology

## 2020-08-15 DIAGNOSIS — L719 Rosacea, unspecified: Secondary | ICD-10-CM | POA: Diagnosis not present

## 2020-08-15 NOTE — Progress Notes (Signed)
   Follow-Up Visit   Subjective  Michele Montgomery is a 79 y.o. female who presents for the following: Red spot (nose, noticed this weekend, no bleeding, no pain or itch). No known history of rosacea.   The following portions of the chart were reviewed this encounter and updated as appropriate:      Review of Systems:  No other skin or systemic complaints except as noted in HPI or Assessment and Plan.  Objective  Well appearing patient in no apparent distress; mood and affect are within normal limits.  A focused examination was performed including nose. Relevant physical exam findings are noted in the Assessment and Plan.  Objective  Left Nasal Tip: Pink macule on left nasal tip. No scale  Images     Assessment & Plan  Rosacea Left Nasal Tip  Resolving inflammatory papule Soolantra Cream - Apply to nose qhs, sample given. Recheck on follow-up.  Return in about 2 weeks (around 08/29/2020) for Recheck nose.   Documentation: I have reviewed the above documentation for accuracy and completeness, and I agree with the above.  [name] MD

## 2020-08-15 NOTE — Patient Instructions (Signed)
Soolantra Cream - Apply to nose every night.

## 2020-08-26 ENCOUNTER — Ambulatory Visit
Admission: RE | Admit: 2020-08-26 | Discharge: 2020-08-26 | Disposition: A | Discharge status: Home / Self Care | Payer: PPO | Source: Ambulatory Visit | Attending: Internal Medicine | Admitting: Internal Medicine

## 2020-08-26 ENCOUNTER — Other Ambulatory Visit: Payer: Self-pay

## 2020-08-26 DIAGNOSIS — Z1231 Encounter for screening mammogram for malignant neoplasm of breast: Secondary | ICD-10-CM | POA: Insufficient documentation

## 2020-08-31 ENCOUNTER — Ambulatory Visit: Payer: PPO | Admitting: Dermatology

## 2020-08-31 ENCOUNTER — Other Ambulatory Visit: Payer: Self-pay

## 2020-08-31 DIAGNOSIS — L719 Rosacea, unspecified: Secondary | ICD-10-CM | POA: Diagnosis not present

## 2020-08-31 NOTE — Progress Notes (Signed)
   Follow-Up Visit   Subjective  Michele Montgomery is a 79 y.o. female who presents for the following: Follow-up (Rosacea of the left nasal tip). Spot on nose has improved using sample of Soolantra Cream.  The following portions of the chart were reviewed this encounter and updated as appropriate:      Review of Systems:  No other skin or systemic complaints except as noted in HPI or Assessment and Plan.  Objective  Well appearing patient in no apparent distress; mood and affect are within normal limits.  A focused examination was performed including nose. Relevant physical exam findings are noted in the Assessment and Plan.  Objective  Left Nasal Tip: 2.30mm pink macule with fading, photo compared   Assessment & Plan  Rosacea Left Nasal Tip  Improving  Continue Soolantra Cream qhs - sample given today. Observe for changes.  Pt will RTC if change noted (growing, scab, bleeding)  Return as scheduled for TBSE.   IJamesetta Orleans, CMA, am acting as scribe for [name], MD .  Documentation: I have reviewed the above documentation for accuracy and completeness, and I agree with the above.  [name] MD

## 2020-09-27 DIAGNOSIS — I495 Sick sinus syndrome: Secondary | ICD-10-CM | POA: Diagnosis not present

## 2020-09-30 DIAGNOSIS — Z79899 Other long term (current) drug therapy: Secondary | ICD-10-CM | POA: Diagnosis not present

## 2020-09-30 DIAGNOSIS — E782 Mixed hyperlipidemia: Secondary | ICD-10-CM | POA: Diagnosis not present

## 2020-09-30 DIAGNOSIS — I10 Essential (primary) hypertension: Secondary | ICD-10-CM | POA: Diagnosis not present

## 2020-10-11 DIAGNOSIS — N1831 Chronic kidney disease, stage 3a: Secondary | ICD-10-CM | POA: Diagnosis not present

## 2020-10-11 DIAGNOSIS — I420 Dilated cardiomyopathy: Secondary | ICD-10-CM | POA: Diagnosis not present

## 2020-10-11 DIAGNOSIS — I10 Essential (primary) hypertension: Secondary | ICD-10-CM | POA: Diagnosis not present

## 2020-10-11 DIAGNOSIS — E782 Mixed hyperlipidemia: Secondary | ICD-10-CM | POA: Diagnosis not present

## 2020-10-11 DIAGNOSIS — I495 Sick sinus syndrome: Secondary | ICD-10-CM | POA: Diagnosis not present

## 2020-10-19 DIAGNOSIS — Z950 Presence of cardiac pacemaker: Secondary | ICD-10-CM | POA: Diagnosis not present

## 2020-10-19 DIAGNOSIS — N1831 Chronic kidney disease, stage 3a: Secondary | ICD-10-CM | POA: Diagnosis not present

## 2020-10-19 DIAGNOSIS — I10 Essential (primary) hypertension: Secondary | ICD-10-CM | POA: Diagnosis not present

## 2020-10-19 DIAGNOSIS — D649 Anemia, unspecified: Secondary | ICD-10-CM | POA: Diagnosis not present

## 2020-10-19 DIAGNOSIS — E538 Deficiency of other specified B group vitamins: Secondary | ICD-10-CM | POA: Diagnosis not present

## 2020-10-19 DIAGNOSIS — E782 Mixed hyperlipidemia: Secondary | ICD-10-CM | POA: Diagnosis not present

## 2020-12-20 ENCOUNTER — Other Ambulatory Visit: Payer: Self-pay

## 2020-12-20 ENCOUNTER — Ambulatory Visit: Payer: PPO | Admitting: Dermatology

## 2020-12-20 DIAGNOSIS — C44311 Basal cell carcinoma of skin of nose: Secondary | ICD-10-CM | POA: Diagnosis not present

## 2020-12-20 DIAGNOSIS — C4491 Basal cell carcinoma of skin, unspecified: Secondary | ICD-10-CM | POA: Insufficient documentation

## 2020-12-20 DIAGNOSIS — D485 Neoplasm of uncertain behavior of skin: Secondary | ICD-10-CM

## 2020-12-20 DIAGNOSIS — L578 Other skin changes due to chronic exposure to nonionizing radiation: Secondary | ICD-10-CM

## 2020-12-20 HISTORY — DX: Basal cell carcinoma of skin, unspecified: C44.91

## 2020-12-20 NOTE — Progress Notes (Signed)
   Follow-Up Visit   Subjective  Michele Montgomery is a 80 y.o. female who presents for the following: Follow-up (Patient here to have a pink spot on her nose rechecked. She was given a sample of Soolantra Cream in November 2021. She scratched the area a week ago and it bled. Area has improved some now, but still there.).  Has not gone away.   The following portions of the chart were reviewed this encounter and updated as appropriate:       Review of Systems:  No other skin or systemic complaints except as noted in HPI or Assessment and Plan.  Objective  Well appearing patient in no apparent distress; mood and affect are within normal limits.  A focused examination was performed including face. Relevant physical exam findings are noted in the Assessment and Plan.  Objective  Left Nasal Tip: 2.9mm pink red macule, hx of bleeding      Assessment & Plan  Neoplasm of uncertain behavior of skin Left Nasal Tip  Skin / nail biopsy Type of biopsy: tangential   Informed consent: discussed and consent obtained   Patient was prepped and draped in usual sterile fashion: Area prepped with alcohol. Anesthesia: the lesion was anesthetized in a standard fashion   Anesthetic:  1% lidocaine w/ epinephrine 1-100,000 buffered w/ 8.4% NaHCO3 Instrument used: flexible razor blade   Hemostasis achieved with: pressure, aluminum chloride and electrodesiccation   Outcome: patient tolerated procedure well   Post-procedure details: wound care instructions given   Post-procedure details comment:  Ointment and small bandage applied  Specimen 1 - Surgical pathology Differential Diagnosis: Folliculitis r/o BCC Check Margins: No 2.32mm pink red macule, hx of bleeding  Actinic Damage - chronic, secondary to cumulative UV radiation exposure/sun exposure over time - diffuse scaly erythematous macules with underlying dyspigmentation - Recommend daily broad spectrum sunscreen SPF 30+ to sun-exposed areas,  reapply every 2 hours as needed.  - Recommend staying in the shade or wearing long sleeves, sun glasses (UVA+UVB protection) and wide brim hats (4-inch brim around the entire circumference of the hat). - Call for new or changing lesions.  Return pending biopsy results.  IJamesetta Orleans, CMA, am acting as scribe for [name], MD .  Documentation: I have reviewed the above documentation for accuracy and completeness, and I agree with the above.  [name] MD

## 2020-12-20 NOTE — Patient Instructions (Signed)

## 2020-12-26 ENCOUNTER — Telehealth: Payer: Self-pay

## 2020-12-26 DIAGNOSIS — C44311 Basal cell carcinoma of skin of nose: Secondary | ICD-10-CM

## 2020-12-26 NOTE — Telephone Encounter (Signed)
Called pt discussed biopsy, pt will talk with her daughter to see if she think pt should have Mohs vrs EDC, pt will call me back tomorrow

## 2020-12-26 NOTE — Telephone Encounter (Signed)
-----   Message from [name], MD sent at 12/26/2020  1:22 PM EDT ----- Skin , left nasal tip BASAL CELL CARCINOMA, NODULAR PATTERN, BASE INVOLVED  BCC skin cancer- Mohs surgery vrs EDC.  EDC can be done in the office and is less invasive (no sutures), but will leave a small depressed round scar same as the skin cancer site.  Mohs surgery would require a referral [facility] or [facility]).  Will also leave a different type of scar, depending on how they repair the defect.

## 2020-12-27 DIAGNOSIS — I495 Sick sinus syndrome: Secondary | ICD-10-CM | POA: Diagnosis not present

## 2020-12-27 NOTE — Telephone Encounter (Signed)
Spoke with pt and she would like to go to Dr. Lacinda Axon for [facility] for treatment.   Referral ordered.

## 2020-12-27 NOTE — Addendum Note (Signed)
Addended by: Harriett Sine on: 12/27/2020 08:37 AM   Modules accepted: Orders

## 2021-01-13 DIAGNOSIS — E782 Mixed hyperlipidemia: Secondary | ICD-10-CM | POA: Diagnosis not present

## 2021-01-13 DIAGNOSIS — Z79899 Other long term (current) drug therapy: Secondary | ICD-10-CM | POA: Diagnosis not present

## 2021-01-13 DIAGNOSIS — I10 Essential (primary) hypertension: Secondary | ICD-10-CM | POA: Diagnosis not present

## 2021-01-16 DIAGNOSIS — R0602 Shortness of breath: Secondary | ICD-10-CM | POA: Diagnosis not present

## 2021-01-16 DIAGNOSIS — E782 Mixed hyperlipidemia: Secondary | ICD-10-CM | POA: Diagnosis not present

## 2021-01-16 DIAGNOSIS — I495 Sick sinus syndrome: Secondary | ICD-10-CM | POA: Diagnosis not present

## 2021-01-16 DIAGNOSIS — N1831 Chronic kidney disease, stage 3a: Secondary | ICD-10-CM | POA: Diagnosis not present

## 2021-01-16 DIAGNOSIS — I10 Essential (primary) hypertension: Secondary | ICD-10-CM | POA: Diagnosis not present

## 2021-02-09 DIAGNOSIS — N1832 Chronic kidney disease, stage 3b: Secondary | ICD-10-CM | POA: Diagnosis not present

## 2021-02-09 DIAGNOSIS — R0602 Shortness of breath: Secondary | ICD-10-CM | POA: Diagnosis not present

## 2021-02-09 DIAGNOSIS — Z0000 Encounter for general adult medical examination without abnormal findings: Secondary | ICD-10-CM | POA: Diagnosis not present

## 2021-02-09 DIAGNOSIS — K449 Diaphragmatic hernia without obstruction or gangrene: Secondary | ICD-10-CM | POA: Diagnosis not present

## 2021-02-09 DIAGNOSIS — E538 Deficiency of other specified B group vitamins: Secondary | ICD-10-CM | POA: Diagnosis not present

## 2021-02-09 DIAGNOSIS — E010 Iodine-deficiency related diffuse (endemic) goiter: Secondary | ICD-10-CM | POA: Diagnosis not present

## 2021-02-09 DIAGNOSIS — R42 Dizziness and giddiness: Secondary | ICD-10-CM | POA: Diagnosis not present

## 2021-02-09 DIAGNOSIS — E782 Mixed hyperlipidemia: Secondary | ICD-10-CM | POA: Diagnosis not present

## 2021-02-09 DIAGNOSIS — I10 Essential (primary) hypertension: Secondary | ICD-10-CM | POA: Diagnosis not present

## 2021-02-09 DIAGNOSIS — D649 Anemia, unspecified: Secondary | ICD-10-CM | POA: Diagnosis not present

## 2021-02-09 DIAGNOSIS — Z950 Presence of cardiac pacemaker: Secondary | ICD-10-CM | POA: Diagnosis not present

## 2021-02-09 DIAGNOSIS — I420 Dilated cardiomyopathy: Secondary | ICD-10-CM | POA: Diagnosis not present

## 2021-02-16 DIAGNOSIS — C44311 Basal cell carcinoma of skin of nose: Secondary | ICD-10-CM | POA: Diagnosis not present

## 2021-02-21 DIAGNOSIS — I6523 Occlusion and stenosis of bilateral carotid arteries: Secondary | ICD-10-CM | POA: Diagnosis not present

## 2021-02-21 DIAGNOSIS — R42 Dizziness and giddiness: Secondary | ICD-10-CM | POA: Diagnosis not present

## 2021-02-23 ENCOUNTER — Telehealth: Payer: Self-pay

## 2021-02-23 NOTE — Telephone Encounter (Signed)
Pt called LM on VM stating Dr Geralynn Ochs office may have left a suture on her nose, Dr Geralynn Ochs office removed her sutures yesterday but she think 1 may still be remaining, she cant drive all the way back to [facility] to have this area checked she would like Dr Nicole Kindred to check her nose.   Okay we will work her on the schedule with Dr Nicole Kindred Monday

## 2021-02-27 ENCOUNTER — Ambulatory Visit: Payer: PPO | Admitting: Dermatology

## 2021-03-08 DIAGNOSIS — H903 Sensorineural hearing loss, bilateral: Secondary | ICD-10-CM | POA: Diagnosis not present

## 2021-03-08 DIAGNOSIS — H6123 Impacted cerumen, bilateral: Secondary | ICD-10-CM | POA: Diagnosis not present

## 2021-03-21 ENCOUNTER — Other Ambulatory Visit: Payer: Self-pay

## 2021-03-21 ENCOUNTER — Ambulatory Visit: Payer: PPO | Admitting: Dermatology

## 2021-03-21 DIAGNOSIS — L821 Other seborrheic keratosis: Secondary | ICD-10-CM | POA: Diagnosis not present

## 2021-03-21 DIAGNOSIS — L820 Inflamed seborrheic keratosis: Secondary | ICD-10-CM | POA: Diagnosis not present

## 2021-03-21 DIAGNOSIS — D1800 Hemangioma unspecified site: Secondary | ICD-10-CM | POA: Diagnosis not present

## 2021-03-21 DIAGNOSIS — L814 Other melanin hyperpigmentation: Secondary | ICD-10-CM | POA: Diagnosis not present

## 2021-03-21 DIAGNOSIS — L853 Xerosis cutis: Secondary | ICD-10-CM

## 2021-03-21 DIAGNOSIS — Z1283 Encounter for screening for malignant neoplasm of skin: Secondary | ICD-10-CM | POA: Diagnosis not present

## 2021-03-21 DIAGNOSIS — Z85828 Personal history of other malignant neoplasm of skin: Secondary | ICD-10-CM | POA: Diagnosis not present

## 2021-03-21 DIAGNOSIS — L578 Other skin changes due to chronic exposure to nonionizing radiation: Secondary | ICD-10-CM

## 2021-03-21 DIAGNOSIS — D229 Melanocytic nevi, unspecified: Secondary | ICD-10-CM

## 2021-03-21 DIAGNOSIS — D692 Other nonthrombocytopenic purpura: Secondary | ICD-10-CM

## 2021-03-21 NOTE — Patient Instructions (Addendum)
If you have any questions or concerns for your doctor, please call our main line at [phone] and press option 4 to reach your doctor's medical assistant. If no one answers, please leave a voicemail as directed and we will return your call as soon as possible. Messages left after 4 pm will be answered the following business day.   You may also send [place] a message via [place]. We typically respond to MyChart messages within 1-2 business days.  For prescription refills, please ask your pharmacy to contact our office. Our fax number is [phone].  If you have an urgent issue when the clinic is closed that cannot wait until the next business day, you can page your doctor at the number below.    Please note that while we do our best to be available for urgent issues outside of office hours, we are not available 24/7.   If you have an urgent issue and are unable to reach [place], you may choose to seek medical care at your doctor's office, retail clinic, urgent care center, or emergency room.  If you have a medical emergency, please immediately call 911 or go to the emergency department.  Pager Numbers  - Dr. Nehemiah Massed: [phone]  - Dr. Laurence Ferrari: [phone]  - Dr. Nicole Kindred: [phone]  In the event of inclement weather, please call our main line at [phone] for an update on the status of any delays or closures.  Dermatology Medication Tips: Please keep the boxes that topical medications come in in order to help keep track of the instructions about where and how to use these. Pharmacies typically print the medication instructions only on the boxes and not directly on the medication tubes.   If your medication is too expensive, please contact our office at [phone] option 4 or send [place] a message through [place].   We are unable to tell what your co-pay for medications will be in advance as this is different depending on your insurance coverage. However, we may be able to find a substitute  medication at lower cost or fill out paperwork to get insurance to cover a needed medication.   If a prior authorization is required to get your medication covered by your insurance company, please allow [place] 1-2 business days to complete this process.  Drug prices often vary depending on where the prescription is filled and some pharmacies may offer cheaper prices.  The website www.goodrx.com contains coupons for medications through different pharmacies. The prices here do not account for what the cost may be with help from insurance (it may be cheaper with your insurance), but the website can give you the price if you did not use any insurance.  Gentle Skin Care Guide  1. Bathe no more than once a day.  2. Avoid bathing in hot water  3. Use a mild soap like Dove, Vanicream, Cetaphil, CeraVe. Can use Lever 2000 or Cetaphil antibacterial soap  4. Use soap only where you need it. On most days, use it under your arms, between your legs, and on your feet. Let the water rinse other areas unless visibly dirty.  5. When you get out of the bath/shower, use a towel to gently blot your skin dry, don't rub it.  6. While your skin is still a little damp, apply a moisturizing cream such as Vanicream, CeraVe, Cetaphil, Eucerin, Sarna lotion or plain Vaseline Jelly. For hands apply Neutrogena [place] Hand Cream or Excipial Hand Cream.  7. Reapply moisturizer any time you start to  itch or feel dry.  8. Sometimes using free and clear laundry detergents can be helpful. Fabric softener sheets should be avoided. Downy Free & Gentle liquid, or any liquid fabric softener that is free of dyes and perfumes, it acceptable to use  9. If your doctor has given you prescription creams you may apply moisturizers over them   - You can print the associated coupon and take it with your prescription to the pharmacy.  - You may also stop by our office during regular business hours and pick up a GoodRx coupon card.  - If  you need your prescription sent electronically to a different pharmacy, notify our office through [facility] or by phone at [phone] option 4.

## 2021-03-21 NOTE — Progress Notes (Signed)
Follow-Up Visit   Subjective  Michele Montgomery is a 80 y.o. female who presents for the following: Annual Exam (Hx BCC L nasal tip - tx on May 5th 2022 with MOHS ). At [facility].  The following portions of the chart were reviewed this encounter and updated as appropriate:      Review of Systems:  No other skin or systemic complaints except as noted in HPI or Assessment and Plan.  Objective  Well appearing patient in no apparent distress; mood and affect are within normal limits.  A full examination was performed including scalp, head, eyes, ears, nose, lips, neck, chest, axillae, abdomen, back, buttocks, bilateral upper extremities, bilateral lower extremities, hands, feet, fingers, toes, fingernails, and toenails. All findings within normal limits unless otherwise noted below.  Objective  L nasal tip: Clear.   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 Observe for recurrence. Call clinic for new or changing lesions.  Recommend regular skin exams, daily broad-spectrum spf 30+ sunscreen use, and photoprotection.     Inflamed seborrheic keratosis R lat eyebrow x 1  Destruction of lesion - R lat eyebrow x 1  Destruction method: cryotherapy   Informed consent: discussed and consent obtained   Lesion destroyed using liquid nitrogen: Yes   Region frozen until ice ball extended beyond lesion: Yes   Outcome: patient tolerated procedure well with no complications   Post-procedure details: wound care instructions given     Lentigines - Scattered tan macules - Due to sun exposure - Benign-appering, observe - Recommend daily broad spectrum sunscreen SPF 30+ to sun-exposed areas, reapply every 2 hours as needed. - Call for any changes  Seborrheic Keratoses - Stuck-on, waxy, tan-brown papules and/or plaques  - Benign-appearing - Discussed benign etiology and  prognosis. - Observe - Call for any changes  Melanocytic Nevi - Tan-brown and/or pink-flesh-colored symmetric macules and papules - Benign appearing on exam today - Observation - Call clinic for new or changing moles - Recommend daily use of broad spectrum spf 30+ sunscreen to sun-exposed areas.   Hemangiomas - Red papules - Discussed benign nature - Observe - Call for any changes  Actinic Damage - Chronic condition, secondary to cumulative UV/sun exposure - diffuse scaly erythematous macules with underlying dyspigmentation - Recommend daily broad spectrum sunscreen SPF 30+ to sun-exposed areas, reapply every 2 hours as needed.  - Staying in the shade or wearing long sleeves, sun glasses (UVA+UVB protection) and wide brim hats (4-inch brim around the entire circumference of the hat) are also recommended for sun protection.  - Call for new or changing lesions.  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  Treatable, but not curable. - Violaceous macules and patches - Benign - Related to trauma, age, sun damage and/or use of blood thinners, chronic use of topical and/or oral steroids - Observe - Can use OTC arnica containing moisturizer such as Dermend Bruise Formula if desired - Call for worsening or other concerns  Xerosis - diffuse xerotic patches - recommend gentle, hydrating skin care - gentle skin care handout given  Skin cancer screening performed today.  Return in about 6 months (around 09/20/2021) for sun exposed areas. Recheck nose.  Luther Redo, CMA, am acting as scribe for [name], MD .  Documentation: I have reviewed the above documentation for accuracy and completeness, and I agree  with the above.  [name] MD

## 2021-03-23 DIAGNOSIS — I420 Dilated cardiomyopathy: Secondary | ICD-10-CM | POA: Diagnosis not present

## 2021-03-23 DIAGNOSIS — I495 Sick sinus syndrome: Secondary | ICD-10-CM | POA: Diagnosis not present

## 2021-03-23 DIAGNOSIS — E782 Mixed hyperlipidemia: Secondary | ICD-10-CM | POA: Diagnosis not present

## 2021-03-23 DIAGNOSIS — I10 Essential (primary) hypertension: Secondary | ICD-10-CM | POA: Diagnosis not present

## 2021-04-05 DIAGNOSIS — I420 Dilated cardiomyopathy: Secondary | ICD-10-CM | POA: Diagnosis not present

## 2021-05-24 DIAGNOSIS — Z950 Presence of cardiac pacemaker: Secondary | ICD-10-CM | POA: Diagnosis not present

## 2021-05-24 DIAGNOSIS — N183 Chronic kidney disease, stage 3 unspecified: Secondary | ICD-10-CM | POA: Diagnosis not present

## 2021-05-24 DIAGNOSIS — Z79899 Other long term (current) drug therapy: Secondary | ICD-10-CM | POA: Diagnosis not present

## 2021-05-24 DIAGNOSIS — E010 Iodine-deficiency related diffuse (endemic) goiter: Secondary | ICD-10-CM | POA: Diagnosis not present

## 2021-05-24 DIAGNOSIS — E538 Deficiency of other specified B group vitamins: Secondary | ICD-10-CM | POA: Diagnosis not present

## 2021-05-24 DIAGNOSIS — E782 Mixed hyperlipidemia: Secondary | ICD-10-CM | POA: Diagnosis not present

## 2021-05-24 DIAGNOSIS — I10 Essential (primary) hypertension: Secondary | ICD-10-CM | POA: Diagnosis not present

## 2021-07-18 DIAGNOSIS — R21 Rash and other nonspecific skin eruption: Secondary | ICD-10-CM | POA: Diagnosis not present

## 2021-07-18 DIAGNOSIS — M6289 Other specified disorders of muscle: Secondary | ICD-10-CM | POA: Diagnosis not present

## 2021-07-18 DIAGNOSIS — R151 Fecal smearing: Secondary | ICD-10-CM | POA: Diagnosis not present

## 2021-07-18 DIAGNOSIS — K649 Unspecified hemorrhoids: Secondary | ICD-10-CM | POA: Diagnosis not present

## 2021-07-25 DIAGNOSIS — I495 Sick sinus syndrome: Secondary | ICD-10-CM | POA: Diagnosis not present

## 2021-08-01 DIAGNOSIS — R21 Rash and other nonspecific skin eruption: Secondary | ICD-10-CM | POA: Diagnosis not present

## 2021-08-01 DIAGNOSIS — K219 Gastro-esophageal reflux disease without esophagitis: Secondary | ICD-10-CM | POA: Diagnosis not present

## 2021-08-01 DIAGNOSIS — R151 Fecal smearing: Secondary | ICD-10-CM | POA: Diagnosis not present

## 2021-08-14 DIAGNOSIS — H40053 Ocular hypertension, bilateral: Secondary | ICD-10-CM | POA: Diagnosis not present

## 2021-09-12 DIAGNOSIS — N183 Chronic kidney disease, stage 3 unspecified: Secondary | ICD-10-CM | POA: Diagnosis not present

## 2021-09-12 DIAGNOSIS — Z1231 Encounter for screening mammogram for malignant neoplasm of breast: Secondary | ICD-10-CM | POA: Diagnosis not present

## 2021-09-12 DIAGNOSIS — E782 Mixed hyperlipidemia: Secondary | ICD-10-CM | POA: Diagnosis not present

## 2021-09-12 DIAGNOSIS — I10 Essential (primary) hypertension: Secondary | ICD-10-CM | POA: Diagnosis not present

## 2021-09-12 DIAGNOSIS — D649 Anemia, unspecified: Secondary | ICD-10-CM | POA: Diagnosis not present

## 2021-09-12 DIAGNOSIS — Z79899 Other long term (current) drug therapy: Secondary | ICD-10-CM | POA: Diagnosis not present

## 2021-09-12 DIAGNOSIS — Z1211 Encounter for screening for malignant neoplasm of colon: Secondary | ICD-10-CM | POA: Diagnosis not present

## 2021-09-13 ENCOUNTER — Other Ambulatory Visit: Payer: Self-pay | Admitting: Internal Medicine

## 2021-09-13 DIAGNOSIS — Z1231 Encounter for screening mammogram for malignant neoplasm of breast: Secondary | ICD-10-CM

## 2021-09-26 DIAGNOSIS — Z1211 Encounter for screening for malignant neoplasm of colon: Secondary | ICD-10-CM | POA: Diagnosis not present

## 2021-10-02 DIAGNOSIS — H40053 Ocular hypertension, bilateral: Secondary | ICD-10-CM | POA: Diagnosis not present

## 2021-10-03 ENCOUNTER — Ambulatory Visit: Payer: PPO | Admitting: Dermatology

## 2021-10-10 DIAGNOSIS — H40059 Ocular hypertension, unspecified eye: Secondary | ICD-10-CM | POA: Diagnosis not present

## 2021-10-11 ENCOUNTER — Ambulatory Visit: Payer: PPO | Admitting: Dermatology

## 2021-10-24 DIAGNOSIS — I420 Dilated cardiomyopathy: Secondary | ICD-10-CM | POA: Diagnosis not present

## 2021-10-27 DIAGNOSIS — R0789 Other chest pain: Secondary | ICD-10-CM | POA: Diagnosis not present

## 2021-10-27 DIAGNOSIS — N183 Chronic kidney disease, stage 3 unspecified: Secondary | ICD-10-CM | POA: Diagnosis not present

## 2021-10-27 DIAGNOSIS — R079 Chest pain, unspecified: Secondary | ICD-10-CM | POA: Diagnosis not present

## 2021-10-27 DIAGNOSIS — Z9889 Other specified postprocedural states: Secondary | ICD-10-CM | POA: Diagnosis not present

## 2021-10-27 DIAGNOSIS — I495 Sick sinus syndrome: Secondary | ICD-10-CM | POA: Diagnosis not present

## 2021-10-27 DIAGNOSIS — E782 Mixed hyperlipidemia: Secondary | ICD-10-CM | POA: Diagnosis not present

## 2021-10-27 DIAGNOSIS — I420 Dilated cardiomyopathy: Secondary | ICD-10-CM | POA: Diagnosis not present

## 2021-10-27 DIAGNOSIS — I10 Essential (primary) hypertension: Secondary | ICD-10-CM | POA: Diagnosis not present

## 2021-11-10 ENCOUNTER — Ambulatory Visit
Admission: RE | Admit: 2021-11-10 | Discharge: 2021-11-10 | Disposition: A | Discharge status: Home / Self Care | Payer: PPO | Source: Ambulatory Visit | Attending: Internal Medicine | Admitting: Internal Medicine

## 2021-11-10 ENCOUNTER — Other Ambulatory Visit: Payer: Self-pay

## 2021-11-10 DIAGNOSIS — Z1231 Encounter for screening mammogram for malignant neoplasm of breast: Secondary | ICD-10-CM | POA: Diagnosis not present

## 2021-11-15 DIAGNOSIS — R0789 Other chest pain: Secondary | ICD-10-CM | POA: Diagnosis not present

## 2021-11-15 DIAGNOSIS — R079 Chest pain, unspecified: Secondary | ICD-10-CM | POA: Diagnosis not present

## 2021-11-24 DIAGNOSIS — I878 Other specified disorders of veins: Secondary | ICD-10-CM | POA: Diagnosis not present

## 2021-11-24 DIAGNOSIS — N183 Chronic kidney disease, stage 3 unspecified: Secondary | ICD-10-CM | POA: Diagnosis not present

## 2021-11-24 DIAGNOSIS — I495 Sick sinus syndrome: Secondary | ICD-10-CM | POA: Diagnosis not present

## 2021-11-24 DIAGNOSIS — I420 Dilated cardiomyopathy: Secondary | ICD-10-CM | POA: Diagnosis not present

## 2021-11-24 DIAGNOSIS — I10 Essential (primary) hypertension: Secondary | ICD-10-CM | POA: Diagnosis not present

## 2021-11-24 DIAGNOSIS — E782 Mixed hyperlipidemia: Secondary | ICD-10-CM | POA: Diagnosis not present

## 2021-11-24 DIAGNOSIS — Z9889 Other specified postprocedural states: Secondary | ICD-10-CM | POA: Diagnosis not present

## 2021-11-27 ENCOUNTER — Other Ambulatory Visit: Payer: Self-pay

## 2021-11-27 ENCOUNTER — Ambulatory Visit: Payer: PPO | Admitting: Dermatology

## 2021-11-27 DIAGNOSIS — L578 Other skin changes due to chronic exposure to nonionizing radiation: Secondary | ICD-10-CM | POA: Diagnosis not present

## 2021-11-27 DIAGNOSIS — L820 Inflamed seborrheic keratosis: Secondary | ICD-10-CM | POA: Diagnosis not present

## 2021-11-27 DIAGNOSIS — D692 Other nonthrombocytopenic purpura: Secondary | ICD-10-CM | POA: Diagnosis not present

## 2021-11-27 DIAGNOSIS — L821 Other seborrheic keratosis: Secondary | ICD-10-CM | POA: Diagnosis not present

## 2021-11-27 DIAGNOSIS — Z85828 Personal history of other malignant neoplasm of skin: Secondary | ICD-10-CM

## 2021-11-27 NOTE — Patient Instructions (Addendum)
Cryotherapy Aftercare  Wash gently with soap and water everyday.   Apply Vaseline and Band-Aid daily until healed.    Seborrheic Keratosis  What causes seborrheic keratoses? Seborrheic keratoses are harmless, common skin growths that first appear during adult life.  As time goes by, more growths appear.  Some people may develop a large number of them.  Seborrheic keratoses appear on both covered and uncovered body parts.  They are not caused by sunlight.  The tendency to develop seborrheic keratoses can be inherited.  They vary in color from skin-colored to gray, brown, or even black.  They can be either smooth or have a rough, warty surface.   Seborrheic keratoses are superficial and look as if they were stuck on the skin.  Under the microscope this type of keratosis looks like layers upon layers of skin.  That is why at times the top layer may seem to fall off, but the rest of the growth remains and re-grows.    Treatment Seborrheic keratoses do not need to be treated, but can easily be removed in the office.  Seborrheic keratoses often cause symptoms when they rub on clothing or jewelry.  Lesions can be in the way of shaving.  If they become inflamed, they can cause itching, soreness, or burning.  Removal of a seborrheic keratosis can be accomplished by freezing, burning, or surgery. If any spot bleeds, scabs, or grows rapidly, please return to have it checked, as these can be an indication of a skin cancer.  If You Need Anything After Your Visit  If you have any questions or concerns for your doctor, please call our main line at [phone] and press option 4 to reach your doctor's medical assistant. If no one answers, please leave a voicemail as directed and we will return your call as soon as possible. Messages left after 4 pm will be answered the following business day.   You may also send [place] a message via [place]. We typically respond to MyChart messages within 1-2 business days.  For  prescription refills, please ask your pharmacy to contact our office. Our fax number is [phone].  If you have an urgent issue when the clinic is closed that cannot wait until the next business day, you can page your doctor at the number below.    Please note that while we do our best to be available for urgent issues outside of office hours, we are not available 24/7.   If you have an urgent issue and are unable to reach [place], you may choose to seek medical care at your doctor's office, retail clinic, urgent care center, or emergency room.  If you have a medical emergency, please immediately call 911 or go to the emergency department.  Pager Numbers  - Dr. Nehemiah Massed: [phone]  - Dr. Laurence Ferrari: [phone]  - Dr. Nicole Kindred: [phone]  In the event of inclement weather, please call our main line at [phone] for an update on the status of any delays or closures.  Dermatology Medication Tips: Please keep the boxes that topical medications come in in order to help keep track of the instructions about where and how to use these. Pharmacies typically print the medication instructions only on the boxes and not directly on the medication tubes.   If your medication is too expensive, please contact our office at [phone] option 4 or send [place] a message through [place].   We are unable to tell what your co-pay for medications will be in advance as  this is different depending on your insurance coverage. However, we may be able to find a substitute medication at lower cost or fill out paperwork to get insurance to cover a needed medication.   If a prior authorization is required to get your medication covered by your insurance company, please allow [place] 1-2 business days to complete this process.  Drug prices often vary depending on where the prescription is filled and some pharmacies may offer cheaper prices.  The website www.goodrx.com contains coupons for medications through different  pharmacies. The prices here do not account for what the cost may be with help from insurance (it may be cheaper with your insurance), but the website can give you the price if you did not use any insurance.  - You can print the associated coupon and take it with your prescription to the pharmacy.  - You may also stop by our office during regular business hours and pick up a GoodRx coupon card.  - If you need your prescription sent electronically to a different pharmacy, notify our office through [facility] or by phone at [phone] option 4.     Si Usted Necesita Algo Despus de Su Visita  Tambin puede enviarnos un mensaje a travs de Pharmacist, community. Por lo general respondemos a los mensajes de MyChart en el transcurso de 1 a 2 das hbiles.  Para renovar recetas, por favor pida a su farmacia que se ponga en contacto con nuestra oficina. Harland Dingwall de fax es [place] [phone].  Si tiene un asunto urgente cuando la clnica est cerrada y que no puede esperar hasta el siguiente da hbil, puede llamar/localizar a su doctor(a) al nmero que aparece a continuacin.   Por favor, tenga en cuenta que aunque hacemos todo lo posible para estar disponibles para asuntos urgentes fuera del horario de [place], no estamos disponibles las 24 horas del da, los 7 das de la [place].   Si tiene un problema urgente y no puede comunicarse con nosotros, puede optar por buscar atencin mdica  en el consultorio de su doctor(a), en una clnica privada, en un centro de atencin urgente o en una sala de emergencias.  Si tiene Engineering geologist, por favor llame inmediatamente al 911 o vaya a la sala de emergencias.  Nmeros de bper  - Dr. Nehemiah Massed: [phone]  - Dra. Moye: [phone]  - Dra. Nicole Kindred: [phone]  En caso de inclemencias del [place], por favor llame a Johnsie Kindred principal al [phone] para una actualizacin sobre el [place] de cualquier retraso o cierre.  Consejos para la  medicacin en dermatologa: Por favor, guarde las cajas en las que vienen los medicamentos de uso tpico para ayudarle a seguir las instrucciones sobre dnde y cmo usarlos. Las farmacias generalmente imprimen las instrucciones del medicamento slo en las cajas y no directamente en los tubos del [place].   Si su medicamento es muy caro, por favor, pngase en contacto con Zigmund Daniel llamando al [phone] y presione la opcin 4 o envenos un mensaje a travs de Pharmacist, community.   No podemos decirle cul ser su copago por los medicamentos por adelantado ya que esto es diferente dependiendo de la cobertura de su seguro. Sin embargo, es posible que podamos encontrar un medicamento sustituto a Electrical engineer un formulario para que el seguro cubra el medicamento que se considera necesario.   Si se requiere una autorizacin previa para que su compaa de seguros [place] su medicamento, por favor permtanos de 1 a 2 das hbiles  para Education officer, community.  Los precios de los medicamentos varan con frecuencia dependiendo del Environmental consultant de dnde se surte la receta y alguna farmacias pueden ofrecer precios ms baratos.  El sitio web www.goodrx.com tiene cupones para medicamentos de Airline pilot. Los precios aqu no tienen en cuenta lo que podra costar con la ayuda del seguro (puede ser ms barato con su seguro), pero el sitio web puede darle el precio si no utiliz Research scientist (physical sciences).  - Puede imprimir el cupn correspondiente y llevarlo con su receta a la farmacia.  - Tambin puede pasar por nuestra oficina durante el horario de atencin regular y Charity fundraiser una tarjeta de cupones de GoodRx.  - Si necesita que su receta se enve electrnicamente a una farmacia diferente, informe a nuestra oficina a travs de MyChart de [place] o por telfono llamando al [phone] y presione la opcin 4.

## 2021-11-27 NOTE — Progress Notes (Signed)
° °  Follow-Up Visit   Subjective  Michele Montgomery is a 81 y.o. female who presents for the following: Follow-up (Patient here for 6 month follow-up and check sun exposed areas. Hx of BCC of the left nasal tip, [facility] 02/16/2021. She has a new spot on her right arm she would like checked. ).   The following portions of the chart were reviewed this encounter and updated as appropriate:       Review of Systems:  No other skin or systemic complaints except as noted in HPI or Assessment and Plan.  Objective  Well appearing patient in no apparent distress; mood and affect are within normal limits.  A focused examination was performed including face, chest, arms. Relevant physical exam findings are noted in the Assessment and Plan.  L nasal tip Well healed scar with no evidence of recurrence.   Glabella x 1, L upper lip x 1 (2) Erythematous stuck-on, waxy papule or plaque    Assessment & Plan  Actinic Damage - chronic, secondary to cumulative UV radiation exposure/sun exposure over time - diffuse scaly erythematous macules with underlying dyspigmentation - Recommend daily broad spectrum sunscreen SPF 30+ to sun-exposed areas, reapply every 2 hours as needed.  - Recommend staying in the shade or wearing long sleeves, sun glasses (UVA+UVB protection) and wide brim hats (4-inch brim around the entire circumference of the hat). - Call for new or changing lesions.  Seborrheic Keratoses - Stuck-on, waxy, tan-brown papules and/or plaques, including right arm  - Benign-appearing - Discussed benign etiology and prognosis. - Observe - Call for any changes  Purpura - Chronic; persistent and recurrent.  Treatable, but not curable. - Violaceous macules and patches - Benign - Related to trauma, age, sun damage and/or use of blood thinners, chronic use of topical and/or oral steroids - Observe - Can use OTC arnica containing moisturizer such as Dermend Bruise Formula if desired - Call for  worsening or other concerns   History of basal cell carcinoma (BCC) L nasal tip  MOHs [facility]/16/[facility]21 at [facility]. Clear. Observe for recurrence. Call clinic for new or changing lesions.  Recommend regular skin exams, daily broad-spectrum spf 30+ sunscreen use, and photoprotection.    Inflamed seborrheic keratosis (2) Glabella x 1, L upper lip x 1  Destruction of lesion - Glabella x 1, L upper lip x 1  Destruction method: cryotherapy   Informed consent: discussed and consent obtained   Lesion destroyed using liquid nitrogen: Yes   Region frozen until ice ball extended beyond lesion: Yes   Outcome: patient tolerated procedure well with no complications   Post-procedure details: wound care instructions given   Additional details:  Prior to procedure, discussed risks of blister formation, small wound, skin dyspigmentation, or rare scar following cryotherapy. Recommend Vaseline ointment to treated areas while healing.    Return in about 6 months (around 05/27/2022) for TBSE, Hx BCC.  IJamesetta Orleans, CMA, am acting as scribe for [name], MD .  Documentation: I have reviewed the above documentation for accuracy and completeness, and I agree with the above.  [name] MD

## 2021-12-29 DIAGNOSIS — I10 Essential (primary) hypertension: Secondary | ICD-10-CM | POA: Diagnosis not present

## 2021-12-29 DIAGNOSIS — I420 Dilated cardiomyopathy: Secondary | ICD-10-CM | POA: Diagnosis not present

## 2021-12-29 DIAGNOSIS — E782 Mixed hyperlipidemia: Secondary | ICD-10-CM | POA: Diagnosis not present

## 2021-12-29 DIAGNOSIS — Z79899 Other long term (current) drug therapy: Secondary | ICD-10-CM | POA: Diagnosis not present

## 2021-12-29 DIAGNOSIS — D649 Anemia, unspecified: Secondary | ICD-10-CM | POA: Diagnosis not present

## 2021-12-29 DIAGNOSIS — N183 Chronic kidney disease, stage 3 unspecified: Secondary | ICD-10-CM | POA: Diagnosis not present

## 2021-12-29 DIAGNOSIS — Z950 Presence of cardiac pacemaker: Secondary | ICD-10-CM | POA: Diagnosis not present

## 2021-12-29 DIAGNOSIS — Z0000 Encounter for general adult medical examination without abnormal findings: Secondary | ICD-10-CM | POA: Diagnosis not present

## 2021-12-29 DIAGNOSIS — E538 Deficiency of other specified B group vitamins: Secondary | ICD-10-CM | POA: Diagnosis not present

## 2022-01-23 DIAGNOSIS — I420 Dilated cardiomyopathy: Secondary | ICD-10-CM | POA: Diagnosis not present

## 2022-04-03 DIAGNOSIS — H40053 Ocular hypertension, bilateral: Secondary | ICD-10-CM | POA: Diagnosis not present

## 2022-04-24 DIAGNOSIS — I420 Dilated cardiomyopathy: Secondary | ICD-10-CM | POA: Diagnosis not present

## 2022-04-30 DIAGNOSIS — I420 Dilated cardiomyopathy: Secondary | ICD-10-CM | POA: Diagnosis not present

## 2022-04-30 DIAGNOSIS — Z950 Presence of cardiac pacemaker: Secondary | ICD-10-CM | POA: Diagnosis not present

## 2022-04-30 DIAGNOSIS — D649 Anemia, unspecified: Secondary | ICD-10-CM | POA: Diagnosis not present

## 2022-04-30 DIAGNOSIS — E538 Deficiency of other specified B group vitamins: Secondary | ICD-10-CM | POA: Diagnosis not present

## 2022-04-30 DIAGNOSIS — Z79899 Other long term (current) drug therapy: Secondary | ICD-10-CM | POA: Diagnosis not present

## 2022-04-30 DIAGNOSIS — E782 Mixed hyperlipidemia: Secondary | ICD-10-CM | POA: Diagnosis not present

## 2022-04-30 DIAGNOSIS — N183 Chronic kidney disease, stage 3 unspecified: Secondary | ICD-10-CM | POA: Diagnosis not present

## 2022-04-30 DIAGNOSIS — I10 Essential (primary) hypertension: Secondary | ICD-10-CM | POA: Diagnosis not present

## 2022-05-28 DIAGNOSIS — E782 Mixed hyperlipidemia: Secondary | ICD-10-CM | POA: Diagnosis not present

## 2022-05-28 DIAGNOSIS — I495 Sick sinus syndrome: Secondary | ICD-10-CM | POA: Diagnosis not present

## 2022-05-28 DIAGNOSIS — Z950 Presence of cardiac pacemaker: Secondary | ICD-10-CM | POA: Diagnosis not present

## 2022-05-28 DIAGNOSIS — I10 Essential (primary) hypertension: Secondary | ICD-10-CM | POA: Diagnosis not present

## 2022-06-04 ENCOUNTER — Ambulatory Visit: Payer: PPO | Admitting: Dermatology

## 2022-06-04 DIAGNOSIS — D1800 Hemangioma unspecified site: Secondary | ICD-10-CM

## 2022-06-04 DIAGNOSIS — Z85828 Personal history of other malignant neoplasm of skin: Secondary | ICD-10-CM

## 2022-06-04 DIAGNOSIS — L578 Other skin changes due to chronic exposure to nonionizing radiation: Secondary | ICD-10-CM | POA: Diagnosis not present

## 2022-06-04 DIAGNOSIS — D229 Melanocytic nevi, unspecified: Secondary | ICD-10-CM

## 2022-06-04 DIAGNOSIS — D692 Other nonthrombocytopenic purpura: Secondary | ICD-10-CM | POA: Diagnosis not present

## 2022-06-04 DIAGNOSIS — L821 Other seborrheic keratosis: Secondary | ICD-10-CM

## 2022-06-04 DIAGNOSIS — L97921 Non-pressure chronic ulcer of unspecified part of left lower leg limited to breakdown of skin: Secondary | ICD-10-CM | POA: Diagnosis not present

## 2022-06-04 DIAGNOSIS — Z1283 Encounter for screening for malignant neoplasm of skin: Secondary | ICD-10-CM

## 2022-06-04 DIAGNOSIS — L814 Other melanin hyperpigmentation: Secondary | ICD-10-CM | POA: Diagnosis not present

## 2022-06-04 MED ORDER — MUPIROCIN 2 % EX OINT: 1.0000 | TOPICAL_OINTMENT | Freq: Every day | CUTANEOUS | 1 refills | Status: DC

## 2022-06-04 NOTE — Progress Notes (Signed)
Follow-Up Visit   Subjective  Michele Montgomery is a 81 y.o. female who presents for the following: Total body skin exam (Hx of BCC L nasal tip). Ulcer on lower leg present since spring, got from trauma hitting leg on corner of furniture.  The patient presents for Total-Body Skin Exam (TBSE) for skin cancer screening and mole check.  The patient has spots, moles and lesions to be evaluated, some may be new or changing and the patient has concerns that these could be cancer.   The following portions of the chart were reviewed this encounter and updated as appropriate:       Review of Systems:  No other skin or systemic complaints except as noted in HPI or Assessment and Plan.  Objective  Well appearing patient in no apparent distress; mood and affect are within normal limits.  A full examination was performed including scalp, head, eyes, ears, nose, lips, neck, chest, axillae, abdomen, back, buttocks, bilateral upper extremities, bilateral lower extremities, hands, feet, fingers, toes, fingernails, and toenails. All findings within normal limits unless otherwise noted below.  L pretibia Ulceration 2.0 x 1.5cm L pretibia       Assessment & Plan   Lentigines - Scattered tan macules - Due to sun exposure - Benign-appearing, observe - Recommend daily broad spectrum sunscreen SPF 30+ to sun-exposed areas, reapply every 2 hours as needed. - Call for any changes  Seborrheic Keratoses - Stuck-on, waxy, tan-brown papules and/or plaques  - Benign-appearing - Discussed benign etiology and prognosis. - Observe - Call for any changes - arms  Purpura - Chronic; persistent and recurrent.  Treatable, but not curable. - Violaceous macules and patches - Benign - Related to trauma, age, sun damage and/or use of blood thinners, chronic use of topical and/or oral steroids - Observe - Can use OTC arnica containing moisturizer such as Dermend Bruise Formula if desired - Call for worsening  or other concerns   Melanocytic Nevi - Tan-brown and/or pink-flesh-colored symmetric macules and papules - Benign appearing on exam today - Observation - Call clinic for new or changing moles - Recommend daily use of broad spectrum spf 30+ sunscreen to sun-exposed areas.   Hemangiomas - Red papules - Discussed benign nature - Observe - Call for any changes  Actinic Damage - Chronic condition, secondary to cumulative UV/sun exposure - diffuse scaly erythematous macules with underlying dyspigmentation - Recommend daily broad spectrum sunscreen SPF 30+ to sun-exposed areas, reapply every 2 hours as needed.  - Staying in the shade or wearing long sleeves, sun glasses (UVA+UVB protection) and wide brim hats (4-inch brim around the entire circumference of the hat) are also recommended for sun protection.  - Call for new or changing lesions. - chest  Skin cancer screening performed today.  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 Qd to wound on leg and cover with bandage   History of Basal Cell Carcinoma of the Skin - No evidence of recurrence today - Recommend regular full body skin exams - Recommend daily broad spectrum sunscreen SPF 30+ to sun-exposed areas, reapply every 2 hours as needed.  - Call if any new or changing lesions are noted between office visits  - L nasal  tip  Return in about 1 month (around 07/05/2022) for Leg ulcer.  I, Michele Montgomery, RMA, am acting as scribe for Michele Patty, MD .  Documentation: I have reviewed the above documentation for accuracy and completeness, and I agree with the above.  Michele Patty MD

## 2022-06-04 NOTE — Patient Instructions (Signed)
Due to recent changes in healthcare laws, you may see results of your pathology and/or laboratory studies on MyChart before the doctors have had a chance to review them. We understand that in some cases there may be results that are confusing or concerning to you. Please understand that not all results are received at the same time and often the doctors may need to interpret multiple results in order to provide you with the best plan of care or course of treatment. Therefore, we ask that you please give us 2 business days to thoroughly review all your results before contacting the office for clarification. Should we see a critical lab result, you will be contacted sooner.   If You Need Anything After Your Visit  If you have any questions or concerns for your doctor, please call our main line at [phone] and press option 4 to reach your doctor's medical assistant. If no one answers, please leave a voicemail as directed and we will return your call as soon as possible. Messages left after 4 pm will be answered the following business day.   You may also send us a message via MyChart. We typically respond to MyChart messages within 1-2 business days.  For prescription refills, please ask your pharmacy to contact our office. Our fax number is [phone].  If you have an urgent issue when the clinic is closed that cannot wait until the next business day, you can page your doctor at the number below.    Please note that while we do our best to be available for urgent issues outside of office hours, we are not available 24/7.   If you have an urgent issue and are unable to reach us, you may choose to seek medical care at your doctor's office, retail clinic, urgent care center, or emergency room.  If you have a medical emergency, please immediately call 911 or go to the emergency department.  Pager Numbers  - Dr. Kowalski: [phone]  - Dr. Moye: [phone]  - Dr. Stewart:  [phone]  In the event of inclement weather, please call our main line at [phone] for an update on the status of any delays or closures.  Dermatology Medication Tips: Please keep the boxes that topical medications come in in order to help keep track of the instructions about where and how to use these. Pharmacies typically print the medication instructions only on the boxes and not directly on the medication tubes.   If your medication is too expensive, please contact our office at [phone] option 4 or send us a message through MyChart.   We are unable to tell what your co-pay for medications will be in advance as this is different depending on your insurance coverage. However, we may be able to find a substitute medication at lower cost or fill out paperwork to get insurance to cover a needed medication.   If a prior authorization is required to get your medication covered by your insurance company, please allow us 1-2 business days to complete this process.  Drug prices often vary depending on where the prescription is filled and some pharmacies may offer cheaper prices.  The website www.goodrx.com contains coupons for medications through different pharmacies. The prices here do not account for what the cost may be with help from insurance (it may be cheaper with your insurance), but the website can give you the price if you did not use any insurance.  - You can print the associated coupon and take it with   your prescription to the pharmacy.  - You may also stop by our office during regular business hours and pick up a GoodRx coupon card.  - If you need your prescription sent electronically to a different pharmacy, notify our office through [place] MyChart or by phone at [phone] option 4.     Si Usted Necesita Algo Despus de Su Visita  Tambin puede enviarnos un mensaje a travs de MyChart. Por lo general respondemos a los mensajes de MyChart en el transcurso de 1 a 2  das hbiles.  Para renovar recetas, por favor pida a su farmacia que se ponga en contacto con nuestra oficina. Nuestro nmero de fax es el [phone].  Si tiene un asunto urgente cuando la clnica est cerrada y que no puede esperar hasta el siguiente da hbil, puede llamar/localizar a su doctor(a) al nmero que aparece a continuacin.   Por favor, tenga en cuenta que aunque hacemos todo lo posible para estar disponibles para asuntos urgentes fuera del horario de oficina, no estamos disponibles las 24 horas del da, los 7 das de la semana.   Si tiene un problema urgente y no puede comunicarse con nosotros, puede optar por buscar atencin mdica  en el consultorio de su doctor(a), en una clnica privada, en un centro de atencin urgente o en una sala de emergencias.  Si tiene una emergencia mdica, por favor llame inmediatamente al 911 o vaya a la sala de emergencias.  Nmeros de bper  - Dr. Kowalski: [phone]  - Dra. Moye: [phone]  - Dra. Stewart: [phone]  En caso de inclemencias del tiempo, por favor llame a nuestra lnea principal al [phone] para una actualizacin sobre el estado de cualquier retraso o cierre.  Consejos para la medicacin en dermatologa: Por favor, guarde las cajas en las que vienen los medicamentos de uso tpico para ayudarle a seguir las instrucciones sobre dnde y cmo usarlos. Las farmacias generalmente imprimen las instrucciones del medicamento slo en las cajas y no directamente en los tubos del medicamento.   Si su medicamento es muy caro, por favor, pngase en contacto con nuestra oficina llamando al [phone] y presione la opcin 4 o envenos un mensaje a travs de MyChart.   No podemos decirle cul ser su copago por los medicamentos por adelantado ya que esto es diferente dependiendo de la cobertura de su seguro. Sin embargo, es posible que podamos encontrar un medicamento sustituto a menor costo o llenar un formulario para que el  seguro cubra el medicamento que se considera necesario.   Si se requiere una autorizacin previa para que su compaa de seguros cubra su medicamento, por favor permtanos de 1 a 2 das hbiles para completar este proceso.  Los precios de los medicamentos varan con frecuencia dependiendo del lugar de dnde se surte la receta y alguna farmacias pueden ofrecer precios ms baratos.  El sitio web www.goodrx.com tiene cupones para medicamentos de diferentes farmacias. Los precios aqu no tienen en cuenta lo que podra costar con la ayuda del seguro (puede ser ms barato con su seguro), pero el sitio web puede darle el precio si no utiliz ningn seguro.  - Puede imprimir el cupn correspondiente y llevarlo con su receta a la farmacia.  - Tambin puede pasar por nuestra oficina durante el horario de atencin regular y recoger una tarjeta de cupones de GoodRx.  - Si necesita que su receta se enve electrnicamente a una farmacia diferente, informe a nuestra oficina a travs de MyChart de [place]   o por telfono llamando al [phone] y presione la opcin 4.  

## 2022-06-21 DIAGNOSIS — H903 Sensorineural hearing loss, bilateral: Secondary | ICD-10-CM | POA: Diagnosis not present

## 2022-06-21 DIAGNOSIS — H6121 Impacted cerumen, right ear: Secondary | ICD-10-CM | POA: Diagnosis not present

## 2022-07-03 ENCOUNTER — Ambulatory Visit: Payer: PPO | Admitting: Dermatology

## 2022-07-03 DIAGNOSIS — D692 Other nonthrombocytopenic purpura: Secondary | ICD-10-CM

## 2022-07-03 DIAGNOSIS — L98491 Non-pressure chronic ulcer of skin of other sites limited to breakdown of skin: Secondary | ICD-10-CM

## 2022-07-03 NOTE — Progress Notes (Signed)
   Follow-Up Visit   Subjective  ZOANNE NEWILL is a 81 y.o. female who presents for the following: Follow-up (Patient here for follow up on ulcer at left lower leg. Patient reports areas is healing. She has used mupirocin ointment. ).  The patient has spots, moles and lesions to be evaluated, some may be new or changing and the patient has concerns that these could be cancer.   The following portions of the chart were reviewed this encounter and updated as appropriate:      Review of Systems: No other skin or systemic complaints except as noted in HPI or Assessment and Plan.   Objective  Well appearing patient in no apparent distress; mood and affect are within normal limits.  A focused examination was performed including left lower leg. Relevant physical exam findings are noted in the Assessment and Plan.  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   Continue mupirocin ointment to aa crusted area     Purpura - Chronic; persistent and recurrent.  Treatable, but not curable. - Violaceous macules and patches - Benign - Related to trauma, age, sun damage and/or use of blood thinners, chronic use of topical and/or oral steroids - Observe - Can use OTC arnica containing moisturizer such as Dermend Bruise Formula if desired - Call for worsening or other concerns  Return if symptoms worsen or fail to improve, for keep follow up as scheduled . I, Ruthell Rummage, CMA, am acting as scribe for [name], MD.

## 2022-07-03 NOTE — Progress Notes (Signed)
foll  Follow-Up Visit   Subjective  Michele Montgomery is a 81 y.o. female who presents for the following: Follow-up (Patient here for follow up on ulcer at left lower leg. Patient reports areas is healing. She has used mupirocin ointment. ).    The following portions of the chart were reviewed this encounter and updated as appropriate:      Review of Systems: No other skin or systemic complaints except as noted in HPI or Assessment and Plan.   Objective  Well appearing patient in no apparent distress; mood and affect are within normal limits.  A focused examination was performed including left leg. Relevant physical exam findings are noted in the Assessment and Plan.  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 Will have scar.  Continue mupirocin ointment to small crusted area and cover until completely healed    Return if symptoms worsen or fail to improve, for keep follow up as scheduled . I, Ruthell Rummage, CMA, am acting as scribe for [name], MD.  Documentation: I have reviewed the above documentation for accuracy and completeness, and I agree with the above.  [name] MD

## 2022-07-03 NOTE — Patient Instructions (Addendum)
Continue mupirocin ointment to crusted area at left lower leg.    Due to recent changes in healthcare laws, you may see results of your pathology and/or laboratory studies on MyChart before the doctors have had a chance to review them. We understand that in some cases there may be results that are confusing or concerning to you. Please understand that not all results are received at the same time and often the doctors may need to interpret multiple results in order to provide you with the best plan of care or course of treatment. Therefore, we ask that you please give [place] 2 business days to thoroughly review all your results before contacting the office for clarification. Should we see a critical lab result, you will be contacted sooner.   If You Need Anything After Your Visit  If you have any questions or concerns for your doctor, please call our main line at [phone] and press option 4 to reach your doctor's medical assistant. If no one answers, please leave a voicemail as directed and we will return your call as soon as possible. Messages left after 4 pm will be answered the following business day.   You may also send [place] a message via [place]. We typically respond to MyChart messages within 1-2 business days.  For prescription refills, please ask your pharmacy to contact our office. Our fax number is [phone].  If you have an urgent issue when the clinic is closed that cannot wait until the next business day, you can page your doctor at the number below.    Please note that while we do our best to be available for urgent issues outside of office hours, we are not available 24/7.   If you have an urgent issue and are unable to reach [place], you may choose to seek medical care at your doctor's office, retail clinic, urgent care center, or emergency room.  If you have a medical emergency, please immediately call 911 or go to the emergency department.  Pager Numbers  - Dr. Nehemiah Massed:  [phone]  - Dr. Laurence Ferrari: [phone]  - Dr. Nicole Kindred: [phone]  In the event of inclement weather, please call our main line at [phone] for an update on the status of any delays or closures.  Dermatology Medication Tips: Please keep the boxes that topical medications come in in order to help keep track of the instructions about where and how to use these. Pharmacies typically print the medication instructions only on the boxes and not directly on the medication tubes.   If your medication is too expensive, please contact our office at [phone] option 4 or send [place] a message through [place].   We are unable to tell what your co-pay for medications will be in advance as this is different depending on your insurance coverage. However, we may be able to find a substitute medication at lower cost or fill out paperwork to get insurance to cover a needed medication.   If a prior authorization is required to get your medication covered by your insurance company, please allow [place] 1-2 business days to complete this process.  Drug prices often vary depending on where the prescription is filled and some pharmacies may offer cheaper prices.  The website www.goodrx.com contains coupons for medications through different pharmacies. The prices here do not account for what the cost may be with help from insurance (it may be cheaper with your insurance), but the website can give you the price if you did not use any  insurance.  - You can print the associated coupon and take it with your prescription to the pharmacy.  - You may also stop by our office during regular business hours and pick up a GoodRx coupon card.  - If you need your prescription sent electronically to a different pharmacy, notify our office through [facility] or by phone at [phone] option 4.     Si Usted Necesita Algo Despus de Su Visita  Tambin puede enviarnos un mensaje a travs de Pharmacist, community. Por lo general  respondemos a los mensajes de MyChart en el transcurso de 1 a 2 das hbiles.  Para renovar recetas, por favor pida a su farmacia que se ponga en contacto con nuestra oficina. Harland Dingwall de fax es [place] [phone].  Si tiene un asunto urgente cuando la clnica est cerrada y que no puede esperar hasta el siguiente da hbil, puede llamar/localizar a su doctor(a) al nmero que aparece a continuacin.   Por favor, tenga en cuenta que aunque hacemos todo lo posible para estar disponibles para asuntos urgentes fuera del horario de [place], no estamos disponibles las 24 horas del da, los 7 das de la [place].   Si tiene un problema urgente y no puede comunicarse con nosotros, puede optar por buscar atencin mdica  en el consultorio de su doctor(a), en una clnica privada, en un centro de atencin urgente o en una sala de emergencias.  Si tiene Engineering geologist, por favor llame inmediatamente al 911 o vaya a la sala de emergencias.  Nmeros de bper  - Dr. Nehemiah Massed: [phone]  - Dra. Moye: [phone]  - Dra. Nicole Kindred: [phone]  En caso de inclemencias del [place], por favor llame a Johnsie Kindred principal al [phone] para una actualizacin sobre el [place] de cualquier retraso o cierre.  Consejos para la medicacin en dermatologa: Por favor, guarde las cajas en las que vienen los medicamentos de uso tpico para ayudarle a seguir las instrucciones sobre dnde y cmo usarlos. Las farmacias generalmente imprimen las instrucciones del medicamento slo en las cajas y no directamente en los tubos del [place].   Si su medicamento es muy caro, por favor, pngase en contacto con Zigmund Daniel llamando al [phone] y presione la opcin 4 o envenos un mensaje a travs de Pharmacist, community.   No podemos decirle cul ser su copago por los medicamentos por adelantado ya que esto es diferente dependiendo de la cobertura de su seguro. Sin embargo, es posible que podamos encontrar un  medicamento sustituto a Electrical engineer un formulario para que el seguro cubra el medicamento que se considera necesario.   Si se requiere una autorizacin previa para que su compaa de seguros [place] su medicamento, por favor permtanos de 1 a 2 das hbiles para completar este proceso.  Los precios de los medicamentos varan con frecuencia dependiendo del Environmental consultant de dnde se surte la receta y alguna farmacias pueden ofrecer precios ms baratos.  El sitio web www.goodrx.com tiene cupones para medicamentos de Airline pilot. Los precios aqu no tienen en cuenta lo que podra costar con la ayuda del seguro (puede ser ms barato con su seguro), pero el sitio web puede darle el precio si no utiliz Research scientist (physical sciences).  - Puede imprimir el cupn correspondiente y llevarlo con su receta a la farmacia.  - Tambin puede pasar por nuestra oficina durante el horario de atencin regular y Charity fundraiser una tarjeta de cupones de GoodRx.  - Si necesita que su receta se enve electrnicamente a Ardelia Mems  farmacia diferente, informe a nuestra oficina a travs de MyChart de [place] o por telfono llamando al [phone] y presione la opcin 4.

## 2022-07-18 DIAGNOSIS — I495 Sick sinus syndrome: Secondary | ICD-10-CM | POA: Diagnosis not present

## 2022-07-31 DIAGNOSIS — Z950 Presence of cardiac pacemaker: Secondary | ICD-10-CM | POA: Diagnosis not present

## 2022-07-31 DIAGNOSIS — E538 Deficiency of other specified B group vitamins: Secondary | ICD-10-CM | POA: Diagnosis not present

## 2022-07-31 DIAGNOSIS — Z1231 Encounter for screening mammogram for malignant neoplasm of breast: Secondary | ICD-10-CM | POA: Diagnosis not present

## 2022-07-31 DIAGNOSIS — I10 Essential (primary) hypertension: Secondary | ICD-10-CM | POA: Diagnosis not present

## 2022-07-31 DIAGNOSIS — D649 Anemia, unspecified: Secondary | ICD-10-CM | POA: Diagnosis not present

## 2022-07-31 DIAGNOSIS — Z79899 Other long term (current) drug therapy: Secondary | ICD-10-CM | POA: Diagnosis not present

## 2022-07-31 DIAGNOSIS — N183 Chronic kidney disease, stage 3 unspecified: Secondary | ICD-10-CM | POA: Diagnosis not present

## 2022-07-31 DIAGNOSIS — E782 Mixed hyperlipidemia: Secondary | ICD-10-CM | POA: Diagnosis not present

## 2022-08-01 ENCOUNTER — Other Ambulatory Visit: Payer: Self-pay | Admitting: Internal Medicine

## 2022-08-01 DIAGNOSIS — Z1231 Encounter for screening mammogram for malignant neoplasm of breast: Secondary | ICD-10-CM

## 2022-08-30 DIAGNOSIS — N183 Chronic kidney disease, stage 3 unspecified: Secondary | ICD-10-CM | POA: Diagnosis not present

## 2022-08-30 DIAGNOSIS — Z79899 Other long term (current) drug therapy: Secondary | ICD-10-CM | POA: Diagnosis not present

## 2022-09-14 DIAGNOSIS — N183 Chronic kidney disease, stage 3 unspecified: Secondary | ICD-10-CM | POA: Diagnosis not present

## 2022-10-03 DIAGNOSIS — H40053 Ocular hypertension, bilateral: Secondary | ICD-10-CM | POA: Diagnosis not present

## 2022-10-23 DIAGNOSIS — I420 Dilated cardiomyopathy: Secondary | ICD-10-CM | POA: Diagnosis not present

## 2022-10-31 DIAGNOSIS — N183 Chronic kidney disease, stage 3 unspecified: Secondary | ICD-10-CM | POA: Diagnosis not present

## 2022-10-31 DIAGNOSIS — D649 Anemia, unspecified: Secondary | ICD-10-CM | POA: Diagnosis not present

## 2022-10-31 DIAGNOSIS — I420 Dilated cardiomyopathy: Secondary | ICD-10-CM | POA: Diagnosis not present

## 2022-10-31 DIAGNOSIS — E782 Mixed hyperlipidemia: Secondary | ICD-10-CM | POA: Diagnosis not present

## 2022-10-31 DIAGNOSIS — Z79899 Other long term (current) drug therapy: Secondary | ICD-10-CM | POA: Diagnosis not present

## 2022-10-31 DIAGNOSIS — Z950 Presence of cardiac pacemaker: Secondary | ICD-10-CM | POA: Diagnosis not present

## 2022-10-31 DIAGNOSIS — I495 Sick sinus syndrome: Secondary | ICD-10-CM | POA: Diagnosis not present

## 2022-10-31 DIAGNOSIS — E538 Deficiency of other specified B group vitamins: Secondary | ICD-10-CM | POA: Diagnosis not present

## 2022-10-31 DIAGNOSIS — I10 Essential (primary) hypertension: Secondary | ICD-10-CM | POA: Diagnosis not present

## 2022-11-12 ENCOUNTER — Ambulatory Visit
Admission: RE | Admit: 2022-11-12 | Discharge: 2022-11-12 | Disposition: A | Discharge status: Home / Self Care | Payer: HMO | Source: Ambulatory Visit | Attending: Internal Medicine | Admitting: Internal Medicine

## 2022-11-12 DIAGNOSIS — Z1231 Encounter for screening mammogram for malignant neoplasm of breast: Secondary | ICD-10-CM | POA: Diagnosis not present

## 2022-11-16 DIAGNOSIS — R944 Abnormal results of kidney function studies: Secondary | ICD-10-CM | POA: Diagnosis not present

## 2022-11-16 DIAGNOSIS — N183 Chronic kidney disease, stage 3 unspecified: Secondary | ICD-10-CM | POA: Diagnosis not present

## 2022-12-26 DIAGNOSIS — I10 Essential (primary) hypertension: Secondary | ICD-10-CM | POA: Diagnosis not present

## 2022-12-26 DIAGNOSIS — R8290 Unspecified abnormal findings in urine: Secondary | ICD-10-CM | POA: Diagnosis not present

## 2022-12-26 DIAGNOSIS — K219 Gastro-esophageal reflux disease without esophagitis: Secondary | ICD-10-CM | POA: Diagnosis not present

## 2022-12-26 DIAGNOSIS — E785 Hyperlipidemia, unspecified: Secondary | ICD-10-CM | POA: Diagnosis not present

## 2022-12-26 DIAGNOSIS — Z950 Presence of cardiac pacemaker: Secondary | ICD-10-CM | POA: Diagnosis not present

## 2022-12-26 DIAGNOSIS — D631 Anemia in chronic kidney disease: Secondary | ICD-10-CM | POA: Diagnosis not present

## 2022-12-26 DIAGNOSIS — N1832 Chronic kidney disease, stage 3b: Secondary | ICD-10-CM | POA: Diagnosis not present

## 2022-12-27 ENCOUNTER — Other Ambulatory Visit: Payer: Self-pay | Admitting: Nephrology

## 2022-12-27 DIAGNOSIS — R8290 Unspecified abnormal findings in urine: Secondary | ICD-10-CM

## 2022-12-27 DIAGNOSIS — N1832 Chronic kidney disease, stage 3b: Secondary | ICD-10-CM

## 2022-12-27 DIAGNOSIS — D631 Anemia in chronic kidney disease: Secondary | ICD-10-CM

## 2023-01-08 ENCOUNTER — Ambulatory Visit
Admission: RE | Admit: 2023-01-08 | Discharge: 2023-01-08 | Disposition: A | Discharge status: Home / Self Care | Payer: HMO | Source: Ambulatory Visit | Attending: Nephrology | Admitting: Nephrology

## 2023-01-08 DIAGNOSIS — N1832 Chronic kidney disease, stage 3b: Secondary | ICD-10-CM | POA: Insufficient documentation

## 2023-01-08 DIAGNOSIS — D631 Anemia in chronic kidney disease: Secondary | ICD-10-CM | POA: Insufficient documentation

## 2023-01-08 DIAGNOSIS — N189 Chronic kidney disease, unspecified: Secondary | ICD-10-CM | POA: Diagnosis not present

## 2023-01-08 DIAGNOSIS — R8290 Unspecified abnormal findings in urine: Secondary | ICD-10-CM | POA: Insufficient documentation

## 2023-01-21 DIAGNOSIS — I10 Essential (primary) hypertension: Secondary | ICD-10-CM | POA: Diagnosis not present

## 2023-01-21 DIAGNOSIS — E785 Hyperlipidemia, unspecified: Secondary | ICD-10-CM | POA: Diagnosis not present

## 2023-01-21 DIAGNOSIS — N1832 Chronic kidney disease, stage 3b: Secondary | ICD-10-CM | POA: Diagnosis not present

## 2023-01-21 DIAGNOSIS — R8290 Unspecified abnormal findings in urine: Secondary | ICD-10-CM | POA: Diagnosis not present

## 2023-01-21 DIAGNOSIS — Z950 Presence of cardiac pacemaker: Secondary | ICD-10-CM | POA: Diagnosis not present

## 2023-01-21 DIAGNOSIS — D631 Anemia in chronic kidney disease: Secondary | ICD-10-CM | POA: Diagnosis not present

## 2023-01-21 DIAGNOSIS — K219 Gastro-esophageal reflux disease without esophagitis: Secondary | ICD-10-CM | POA: Diagnosis not present

## 2023-01-22 DIAGNOSIS — K219 Gastro-esophageal reflux disease without esophagitis: Secondary | ICD-10-CM | POA: Diagnosis not present

## 2023-01-22 DIAGNOSIS — I10 Essential (primary) hypertension: Secondary | ICD-10-CM | POA: Diagnosis not present

## 2023-01-22 DIAGNOSIS — R8290 Unspecified abnormal findings in urine: Secondary | ICD-10-CM | POA: Diagnosis not present

## 2023-01-22 DIAGNOSIS — I420 Dilated cardiomyopathy: Secondary | ICD-10-CM | POA: Diagnosis not present

## 2023-01-22 DIAGNOSIS — Z950 Presence of cardiac pacemaker: Secondary | ICD-10-CM | POA: Diagnosis not present

## 2023-01-22 DIAGNOSIS — E785 Hyperlipidemia, unspecified: Secondary | ICD-10-CM | POA: Diagnosis not present

## 2023-01-22 DIAGNOSIS — N1832 Chronic kidney disease, stage 3b: Secondary | ICD-10-CM | POA: Diagnosis not present

## 2023-01-22 DIAGNOSIS — D631 Anemia in chronic kidney disease: Secondary | ICD-10-CM | POA: Diagnosis not present

## 2023-01-29 DIAGNOSIS — K219 Gastro-esophageal reflux disease without esophagitis: Secondary | ICD-10-CM | POA: Diagnosis not present

## 2023-01-29 DIAGNOSIS — I10 Essential (primary) hypertension: Secondary | ICD-10-CM | POA: Diagnosis not present

## 2023-01-29 DIAGNOSIS — D631 Anemia in chronic kidney disease: Secondary | ICD-10-CM | POA: Diagnosis not present

## 2023-01-29 DIAGNOSIS — E785 Hyperlipidemia, unspecified: Secondary | ICD-10-CM | POA: Diagnosis not present

## 2023-01-29 DIAGNOSIS — R8290 Unspecified abnormal findings in urine: Secondary | ICD-10-CM | POA: Diagnosis not present

## 2023-01-29 DIAGNOSIS — Z950 Presence of cardiac pacemaker: Secondary | ICD-10-CM | POA: Diagnosis not present

## 2023-01-29 DIAGNOSIS — N1832 Chronic kidney disease, stage 3b: Secondary | ICD-10-CM | POA: Diagnosis not present

## 2023-01-29 DIAGNOSIS — F0154 Vascular dementia, unspecified severity, with anxiety: Secondary | ICD-10-CM | POA: Diagnosis not present

## 2023-02-04 DIAGNOSIS — I10 Essential (primary) hypertension: Secondary | ICD-10-CM | POA: Diagnosis not present

## 2023-02-04 DIAGNOSIS — E538 Deficiency of other specified B group vitamins: Secondary | ICD-10-CM | POA: Diagnosis not present

## 2023-02-04 DIAGNOSIS — D649 Anemia, unspecified: Secondary | ICD-10-CM | POA: Diagnosis not present

## 2023-02-04 DIAGNOSIS — Z950 Presence of cardiac pacemaker: Secondary | ICD-10-CM | POA: Diagnosis not present

## 2023-02-04 DIAGNOSIS — E782 Mixed hyperlipidemia: Secondary | ICD-10-CM | POA: Diagnosis not present

## 2023-02-04 DIAGNOSIS — Z79899 Other long term (current) drug therapy: Secondary | ICD-10-CM | POA: Diagnosis not present

## 2023-02-04 DIAGNOSIS — N183 Chronic kidney disease, stage 3 unspecified: Secondary | ICD-10-CM | POA: Diagnosis not present

## 2023-02-04 DIAGNOSIS — Z1211 Encounter for screening for malignant neoplasm of colon: Secondary | ICD-10-CM | POA: Diagnosis not present

## 2023-02-04 DIAGNOSIS — Z0000 Encounter for general adult medical examination without abnormal findings: Secondary | ICD-10-CM | POA: Diagnosis not present

## 2023-02-15 DIAGNOSIS — Z1211 Encounter for screening for malignant neoplasm of colon: Secondary | ICD-10-CM | POA: Diagnosis not present

## 2023-03-08 DIAGNOSIS — Z79899 Other long term (current) drug therapy: Secondary | ICD-10-CM | POA: Diagnosis not present

## 2023-03-08 DIAGNOSIS — E782 Mixed hyperlipidemia: Secondary | ICD-10-CM | POA: Diagnosis not present

## 2023-03-08 DIAGNOSIS — I10 Essential (primary) hypertension: Secondary | ICD-10-CM | POA: Diagnosis not present

## 2023-03-18 DIAGNOSIS — I420 Dilated cardiomyopathy: Secondary | ICD-10-CM | POA: Diagnosis not present

## 2023-03-26 ENCOUNTER — Ambulatory Visit (INDEPENDENT_AMBULATORY_CARE_PROVIDER_SITE_OTHER): Payer: HMO | Admitting: Dermatology

## 2023-03-26 VITALS — BP 159/81 | HR 75

## 2023-03-26 DIAGNOSIS — X32XXXA Exposure to sunlight, initial encounter: Secondary | ICD-10-CM | POA: Diagnosis not present

## 2023-03-26 DIAGNOSIS — L578 Other skin changes due to chronic exposure to nonionizing radiation: Secondary | ICD-10-CM | POA: Diagnosis not present

## 2023-03-26 DIAGNOSIS — Z85828 Personal history of other malignant neoplasm of skin: Secondary | ICD-10-CM

## 2023-03-26 DIAGNOSIS — L820 Inflamed seborrheic keratosis: Secondary | ICD-10-CM | POA: Diagnosis not present

## 2023-03-26 DIAGNOSIS — W908XXA Exposure to other nonionizing radiation, initial encounter: Secondary | ICD-10-CM | POA: Diagnosis not present

## 2023-03-26 DIAGNOSIS — L570 Actinic keratosis: Secondary | ICD-10-CM | POA: Diagnosis not present

## 2023-03-26 NOTE — Progress Notes (Signed)
Follow-Up Visit   Subjective  NANDA BITTICK is a 82 y.o. female who presents for the following: Spots to check on the right paranasal and left cheek. No bleeding. She has an itchy spot on her right upper arm.    The following portions of the chart were reviewed this encounter and updated as appropriate: medications, allergies, medical history  Review of Systems:  No other skin or systemic complaints except as noted in HPI or Assessment and Plan.  Objective  Well appearing patient in no apparent distress; mood and affect are within normal limits.  A focused examination was performed of the following areas: Face, arm  Relevant physical exam findings are noted in the Assessment and Plan.  Right Upper Arm Erythematous stuck-on, waxy papule  nose x 3 Pink scaly macules.     Assessment & Plan   Inflamed seborrheic keratosis Right Upper Arm  Symptomatic, irritating, patient would like treated.  Destruction of lesion - Right Upper Arm  Destruction method: cryotherapy   Informed consent: discussed and consent obtained   Lesion destroyed using liquid nitrogen: Yes   Region frozen until ice ball extended beyond lesion: Yes   Outcome: patient tolerated procedure well with no complications   Post-procedure details: wound care instructions given   Additional details:  Prior to procedure, discussed risks of blister formation, small wound, skin dyspigmentation, or rare scar following cryotherapy. Recommend Vaseline ointment to treated areas while healing.   AK (actinic keratosis) nose x 3  Actinic keratoses are precancerous spots that appear secondary to cumulative UV radiation exposure/sun exposure over time. They are chronic with expected duration over 1 year. A portion of actinic keratoses will progress to squamous cell carcinoma of the skin. It is not possible to reliably predict which spots will progress to skin cancer and so treatment is recommended to prevent development  of skin cancer.  Recommend daily broad spectrum sunscreen SPF 30+ to sun-exposed areas, reapply every 2 hours as needed.  Recommend staying in the shade or wearing long sleeves, sun glasses (UVA+UVB protection) and wide brim hats (4-inch brim around the entire circumference of the hat). Call for new or changing lesions.  Destruction of lesion - nose x 3  Destruction method: cryotherapy   Informed consent: discussed and consent obtained   Lesion destroyed using liquid nitrogen: Yes   Region frozen until ice ball extended beyond lesion: Yes   Outcome: patient tolerated procedure well with no complications   Post-procedure details: wound care instructions given   Additional details:  Prior to procedure, discussed risks of blister formation, small wound, skin dyspigmentation, or rare scar following cryotherapy. Recommend Vaseline ointment to treated areas while healing.   ACTINIC DAMAGE - chronic, secondary to cumulative UV radiation exposure/sun exposure over time - diffuse scaly erythematous macules with underlying dyspigmentation - Recommend daily broad spectrum sunscreen SPF 30+ to sun-exposed areas, reapply every 2 hours as needed.  - Recommend staying in the shade or wearing long sleeves, sun glasses (UVA+UVB protection) and wide brim hats (4-inch brim around the entire circumference of the hat). - Call for new or changing lesions.  HISTORY OF BASAL CELL CARCINOMA OF THE SKIN - No evidence of recurrence today of the left nasal tip - Recommend regular full body skin exams - Recommend daily broad spectrum sunscreen SPF 30+ to sun-exposed areas, reapply every 2 hours as needed.  - Call if any new or changing lesions are noted between office visits    Return as scheduled, for TBSE,  Hx BCC, Hx AKs.  ICherlyn Labella, CMA, am acting as scribe for Willeen Niece, MD .   Documentation: I have reviewed the above documentation for accuracy and completeness, and I agree with the  above.  Willeen Niece, MD

## 2023-03-26 NOTE — Patient Instructions (Addendum)
Cryotherapy Aftercare  Wash gently with soap and water everyday.   Apply Vaseline and Band-Aid daily until healed.     Due to recent changes in healthcare laws, you may see results of your pathology and/or laboratory studies on MyChart before the doctors have had a chance to review them. We understand that in some cases there may be results that are confusing or concerning to you. Please understand that not all results are received at the same time and often the doctors may need to interpret multiple results in order to provide you with the best plan of care or course of treatment. Therefore, we ask that you please give us 2 business days to thoroughly review all your results before contacting the office for clarification. Should we see a critical lab result, you will be contacted sooner.   If You Need Anything After Your Visit  If you have any questions or concerns for your doctor, please call our main line at [phone] and press option 4 to reach your doctor's medical assistant. If no one answers, please leave a voicemail as directed and we will return your call as soon as possible. Messages left after 4 pm will be answered the following business day.   You may also send us a message via MyChart. We typically respond to MyChart messages within 1-2 business days.  For prescription refills, please ask your pharmacy to contact our office. Our fax number is [phone].  If you have an urgent issue when the clinic is closed that cannot wait until the next business day, you can page your doctor at the number below.    Please note that while we do our best to be available for urgent issues outside of office hours, we are not available 24/7.   If you have an urgent issue and are unable to reach us, you may choose to seek medical care at your doctor's office, retail clinic, urgent care center, or emergency room.  If you have a medical emergency, please immediately call 911 or go to the  emergency department.  Pager Numbers  - Dr. Kowalski: [phone]  - Dr. Moye: [phone]  - Dr. Stewart: [phone]  In the event of inclement weather, please call our main line at [phone] for an update on the status of any delays or closures.  Dermatology Medication Tips: Please keep the boxes that topical medications come in in order to help keep track of the instructions about where and how to use these. Pharmacies typically print the medication instructions only on the boxes and not directly on the medication tubes.   If your medication is too expensive, please contact our office at [phone] option 4 or send us a message through MyChart.   We are unable to tell what your co-pay for medications will be in advance as this is different depending on your insurance coverage. However, we may be able to find a substitute medication at lower cost or fill out paperwork to get insurance to cover a needed medication.   If a prior authorization is required to get your medication covered by your insurance company, please allow us 1-2 business days to complete this process.  Drug prices often vary depending on where the prescription is filled and some pharmacies may offer cheaper prices.  The website www.goodrx.com contains coupons for medications through different pharmacies. The prices here do not account for what the cost may be with help from insurance (it may be cheaper with your insurance), but the website can   give you the price if you did not use any insurance.  - You can print the associated coupon and take it with your prescription to the pharmacy.  - You may also stop by our office during regular business hours and pick up a GoodRx coupon card.  - If you need your prescription sent electronically to a different pharmacy, notify our office through  MyChart or by phone at [phone] option 4.     Si Usted Necesita Algo Despus de Su Visita  Tambin puede  enviarnos un mensaje a travs de MyChart. Por lo general respondemos a los mensajes de MyChart en el transcurso de 1 a 2 das hbiles.  Para renovar recetas, por favor pida a su farmacia que se ponga en contacto con nuestra oficina. Nuestro nmero de fax es el [phone].  Si tiene un asunto urgente cuando la clnica est cerrada y que no puede esperar hasta el siguiente da hbil, puede llamar/localizar a su doctor(a) al nmero que aparece a continuacin.   Por favor, tenga en cuenta que aunque hacemos todo lo posible para estar disponibles para asuntos urgentes fuera del horario de oficina, no estamos disponibles las 24 horas del da, los 7 das de la semana.   Si tiene un problema urgente y no puede comunicarse con nosotros, puede optar por buscar atencin mdica  en el consultorio de su doctor(a), en una clnica privada, en un centro de atencin urgente o en una sala de emergencias.  Si tiene una emergencia mdica, por favor llame inmediatamente al 911 o vaya a la sala de emergencias.  Nmeros de bper  - Dr. Kowalski: [phone]  - Dra. Moye: [phone]  - Dra. Stewart: [phone]  En caso de inclemencias del tiempo, por favor llame a nuestra lnea principal al [phone] para una actualizacin sobre el estado de cualquier retraso o cierre.  Consejos para la medicacin en dermatologa: Por favor, guarde las cajas en las que vienen los medicamentos de uso tpico para ayudarle a seguir las instrucciones sobre dnde y cmo usarlos. Las farmacias generalmente imprimen las instrucciones del medicamento slo en las cajas y no directamente en los tubos del medicamento.   Si su medicamento es muy caro, por favor, pngase en contacto con nuestra oficina llamando al [phone] y presione la opcin 4 o envenos un mensaje a travs de MyChart.   No podemos decirle cul ser su copago por los medicamentos por adelantado ya que esto es diferente dependiendo de la cobertura de su seguro.  Sin embargo, es posible que podamos encontrar un medicamento sustituto a menor costo o llenar un formulario para que el seguro cubra el medicamento que se considera necesario.   Si se requiere una autorizacin previa para que su compaa de seguros cubra su medicamento, por favor permtanos de 1 a 2 das hbiles para completar este proceso.  Los precios de los medicamentos varan con frecuencia dependiendo del lugar de dnde se surte la receta y alguna farmacias pueden ofrecer precios ms baratos.  El sitio web www.goodrx.com tiene cupones para medicamentos de diferentes farmacias. Los precios aqu no tienen en cuenta lo que podra costar con la ayuda del seguro (puede ser ms barato con su seguro), pero el sitio web puede darle el precio si no utiliz ningn seguro.  - Puede imprimir el cupn correspondiente y llevarlo con su receta a la farmacia.  - Tambin puede pasar por nuestra oficina durante el horario de atencin regular y recoger una tarjeta de cupones de GoodRx.  -   Si necesita que su receta se enve electrnicamente a una farmacia diferente, informe a nuestra oficina a travs de MyChart de [facility] o por telfono llamando al [phone] y presione la opcin 4.  

## 2023-04-01 DIAGNOSIS — H40059 Ocular hypertension, unspecified eye: Secondary | ICD-10-CM | POA: Diagnosis not present

## 2023-04-12 ENCOUNTER — Ambulatory Visit
Admission: RE | Admit: 2023-04-12 | Discharge: 2023-04-12 | Disposition: A | Discharge status: Home / Self Care | Payer: HMO | Source: Ambulatory Visit | Attending: Physician Assistant | Admitting: Physician Assistant

## 2023-04-12 ENCOUNTER — Other Ambulatory Visit: Payer: Self-pay | Admitting: Physician Assistant

## 2023-04-12 DIAGNOSIS — I10 Essential (primary) hypertension: Secondary | ICD-10-CM | POA: Diagnosis not present

## 2023-04-12 DIAGNOSIS — R1013 Epigastric pain: Secondary | ICD-10-CM | POA: Diagnosis not present

## 2023-04-12 DIAGNOSIS — R1011 Right upper quadrant pain: Secondary | ICD-10-CM | POA: Insufficient documentation

## 2023-04-12 DIAGNOSIS — R109 Unspecified abdominal pain: Secondary | ICD-10-CM | POA: Diagnosis not present

## 2023-04-12 DIAGNOSIS — K5730 Diverticulosis of large intestine without perforation or abscess without bleeding: Secondary | ICD-10-CM | POA: Diagnosis not present

## 2023-04-12 DIAGNOSIS — K449 Diaphragmatic hernia without obstruction or gangrene: Secondary | ICD-10-CM | POA: Diagnosis not present

## 2023-04-12 DIAGNOSIS — D259 Leiomyoma of uterus, unspecified: Secondary | ICD-10-CM | POA: Diagnosis not present

## 2023-04-23 DIAGNOSIS — I420 Dilated cardiomyopathy: Secondary | ICD-10-CM | POA: Diagnosis not present

## 2023-05-15 DIAGNOSIS — L03012 Cellulitis of left finger: Secondary | ICD-10-CM | POA: Diagnosis not present

## 2023-05-15 DIAGNOSIS — L980 Pyogenic granuloma: Secondary | ICD-10-CM | POA: Diagnosis not present

## 2023-05-24 DIAGNOSIS — L980 Pyogenic granuloma: Secondary | ICD-10-CM | POA: Diagnosis not present

## 2023-05-29 DIAGNOSIS — I420 Dilated cardiomyopathy: Secondary | ICD-10-CM | POA: Diagnosis not present

## 2023-05-29 DIAGNOSIS — N183 Chronic kidney disease, stage 3 unspecified: Secondary | ICD-10-CM | POA: Diagnosis not present

## 2023-05-29 DIAGNOSIS — Z9889 Other specified postprocedural states: Secondary | ICD-10-CM | POA: Diagnosis not present

## 2023-05-29 DIAGNOSIS — I10 Essential (primary) hypertension: Secondary | ICD-10-CM | POA: Diagnosis not present

## 2023-05-29 DIAGNOSIS — E782 Mixed hyperlipidemia: Secondary | ICD-10-CM | POA: Diagnosis not present

## 2023-05-29 DIAGNOSIS — I495 Sick sinus syndrome: Secondary | ICD-10-CM | POA: Diagnosis not present

## 2023-05-31 DIAGNOSIS — L980 Pyogenic granuloma: Secondary | ICD-10-CM | POA: Diagnosis not present

## 2023-06-06 DIAGNOSIS — N1832 Chronic kidney disease, stage 3b: Secondary | ICD-10-CM | POA: Diagnosis not present

## 2023-06-06 DIAGNOSIS — D649 Anemia, unspecified: Secondary | ICD-10-CM | POA: Diagnosis not present

## 2023-06-06 DIAGNOSIS — D631 Anemia in chronic kidney disease: Secondary | ICD-10-CM | POA: Diagnosis not present

## 2023-06-06 DIAGNOSIS — I10 Essential (primary) hypertension: Secondary | ICD-10-CM | POA: Diagnosis not present

## 2023-06-06 DIAGNOSIS — E785 Hyperlipidemia, unspecified: Secondary | ICD-10-CM | POA: Diagnosis not present

## 2023-06-06 DIAGNOSIS — N183 Chronic kidney disease, stage 3 unspecified: Secondary | ICD-10-CM | POA: Diagnosis not present

## 2023-06-06 DIAGNOSIS — R8290 Unspecified abnormal findings in urine: Secondary | ICD-10-CM | POA: Diagnosis not present

## 2023-06-06 DIAGNOSIS — E782 Mixed hyperlipidemia: Secondary | ICD-10-CM | POA: Diagnosis not present

## 2023-06-06 DIAGNOSIS — K219 Gastro-esophageal reflux disease without esophagitis: Secondary | ICD-10-CM | POA: Diagnosis not present

## 2023-06-06 DIAGNOSIS — Z79899 Other long term (current) drug therapy: Secondary | ICD-10-CM | POA: Diagnosis not present

## 2023-06-06 DIAGNOSIS — E538 Deficiency of other specified B group vitamins: Secondary | ICD-10-CM | POA: Diagnosis not present

## 2023-06-06 DIAGNOSIS — F0154 Vascular dementia, unspecified severity, with anxiety: Secondary | ICD-10-CM | POA: Diagnosis not present

## 2023-06-06 DIAGNOSIS — Z950 Presence of cardiac pacemaker: Secondary | ICD-10-CM | POA: Diagnosis not present

## 2023-06-11 DIAGNOSIS — F0154 Vascular dementia, unspecified severity, with anxiety: Secondary | ICD-10-CM | POA: Diagnosis not present

## 2023-06-11 DIAGNOSIS — N1832 Chronic kidney disease, stage 3b: Secondary | ICD-10-CM | POA: Diagnosis not present

## 2023-06-11 DIAGNOSIS — R8290 Unspecified abnormal findings in urine: Secondary | ICD-10-CM | POA: Diagnosis not present

## 2023-06-11 DIAGNOSIS — D631 Anemia in chronic kidney disease: Secondary | ICD-10-CM | POA: Diagnosis not present

## 2023-06-11 DIAGNOSIS — I10 Essential (primary) hypertension: Secondary | ICD-10-CM | POA: Diagnosis not present

## 2023-06-11 DIAGNOSIS — E785 Hyperlipidemia, unspecified: Secondary | ICD-10-CM | POA: Diagnosis not present

## 2023-06-11 DIAGNOSIS — Z950 Presence of cardiac pacemaker: Secondary | ICD-10-CM | POA: Diagnosis not present

## 2023-06-11 DIAGNOSIS — K219 Gastro-esophageal reflux disease without esophagitis: Secondary | ICD-10-CM | POA: Diagnosis not present

## 2023-08-07 DIAGNOSIS — I495 Sick sinus syndrome: Secondary | ICD-10-CM | POA: Diagnosis not present

## 2023-08-28 DIAGNOSIS — H40059 Ocular hypertension, unspecified eye: Secondary | ICD-10-CM | POA: Diagnosis not present

## 2023-08-28 DIAGNOSIS — H538 Other visual disturbances: Secondary | ICD-10-CM | POA: Diagnosis not present

## 2023-08-30 DIAGNOSIS — Z79899 Other long term (current) drug therapy: Secondary | ICD-10-CM | POA: Diagnosis not present

## 2023-08-30 DIAGNOSIS — E782 Mixed hyperlipidemia: Secondary | ICD-10-CM | POA: Diagnosis not present

## 2023-08-30 DIAGNOSIS — I10 Essential (primary) hypertension: Secondary | ICD-10-CM | POA: Diagnosis not present

## 2023-09-04 DIAGNOSIS — Z1231 Encounter for screening mammogram for malignant neoplasm of breast: Secondary | ICD-10-CM | POA: Diagnosis not present

## 2023-09-04 DIAGNOSIS — N183 Chronic kidney disease, stage 3 unspecified: Secondary | ICD-10-CM | POA: Diagnosis not present

## 2023-09-04 DIAGNOSIS — E010 Iodine-deficiency related diffuse (endemic) goiter: Secondary | ICD-10-CM | POA: Diagnosis not present

## 2023-09-04 DIAGNOSIS — D649 Anemia, unspecified: Secondary | ICD-10-CM | POA: Diagnosis not present

## 2023-09-04 DIAGNOSIS — K449 Diaphragmatic hernia without obstruction or gangrene: Secondary | ICD-10-CM | POA: Diagnosis not present

## 2023-09-04 DIAGNOSIS — I10 Essential (primary) hypertension: Secondary | ICD-10-CM | POA: Diagnosis not present

## 2023-09-04 DIAGNOSIS — R053 Chronic cough: Secondary | ICD-10-CM | POA: Diagnosis not present

## 2023-09-04 DIAGNOSIS — E782 Mixed hyperlipidemia: Secondary | ICD-10-CM | POA: Diagnosis not present

## 2023-09-05 ENCOUNTER — Other Ambulatory Visit: Payer: Self-pay | Admitting: Internal Medicine

## 2023-09-05 DIAGNOSIS — Z1231 Encounter for screening mammogram for malignant neoplasm of breast: Secondary | ICD-10-CM

## 2023-10-23 DIAGNOSIS — H40059 Ocular hypertension, unspecified eye: Secondary | ICD-10-CM | POA: Diagnosis not present

## 2023-10-23 DIAGNOSIS — Z961 Presence of intraocular lens: Secondary | ICD-10-CM | POA: Diagnosis not present

## 2023-11-14 ENCOUNTER — Ambulatory Visit
Admission: RE | Admit: 2023-11-14 | Discharge: 2023-11-14 | Disposition: A | Discharge status: Home / Self Care | Payer: HMO | Source: Ambulatory Visit | Attending: Internal Medicine | Admitting: Internal Medicine

## 2023-11-14 DIAGNOSIS — Z1231 Encounter for screening mammogram for malignant neoplasm of breast: Secondary | ICD-10-CM | POA: Diagnosis not present

## 2023-11-19 DIAGNOSIS — I420 Dilated cardiomyopathy: Secondary | ICD-10-CM | POA: Diagnosis not present

## 2023-11-19 DIAGNOSIS — Z950 Presence of cardiac pacemaker: Secondary | ICD-10-CM | POA: Diagnosis not present

## 2023-11-19 DIAGNOSIS — I495 Sick sinus syndrome: Secondary | ICD-10-CM | POA: Diagnosis not present

## 2023-11-28 DIAGNOSIS — N183 Chronic kidney disease, stage 3 unspecified: Secondary | ICD-10-CM | POA: Diagnosis not present

## 2023-11-28 DIAGNOSIS — I420 Dilated cardiomyopathy: Secondary | ICD-10-CM | POA: Diagnosis not present

## 2023-11-28 DIAGNOSIS — Z9889 Other specified postprocedural states: Secondary | ICD-10-CM | POA: Diagnosis not present

## 2023-11-28 DIAGNOSIS — E782 Mixed hyperlipidemia: Secondary | ICD-10-CM | POA: Diagnosis not present

## 2023-11-28 DIAGNOSIS — I10 Essential (primary) hypertension: Secondary | ICD-10-CM | POA: Diagnosis not present

## 2023-11-28 DIAGNOSIS — R0602 Shortness of breath: Secondary | ICD-10-CM | POA: Diagnosis not present

## 2023-11-28 DIAGNOSIS — I495 Sick sinus syndrome: Secondary | ICD-10-CM | POA: Diagnosis not present

## 2023-12-06 DIAGNOSIS — R0602 Shortness of breath: Secondary | ICD-10-CM | POA: Diagnosis not present

## 2023-12-12 DIAGNOSIS — N1832 Chronic kidney disease, stage 3b: Secondary | ICD-10-CM | POA: Diagnosis not present

## 2023-12-12 DIAGNOSIS — K219 Gastro-esophageal reflux disease without esophagitis: Secondary | ICD-10-CM | POA: Diagnosis not present

## 2023-12-12 DIAGNOSIS — R8290 Unspecified abnormal findings in urine: Secondary | ICD-10-CM | POA: Diagnosis not present

## 2023-12-12 DIAGNOSIS — Z950 Presence of cardiac pacemaker: Secondary | ICD-10-CM | POA: Diagnosis not present

## 2023-12-12 DIAGNOSIS — I10 Essential (primary) hypertension: Secondary | ICD-10-CM | POA: Diagnosis not present

## 2023-12-12 DIAGNOSIS — E785 Hyperlipidemia, unspecified: Secondary | ICD-10-CM | POA: Diagnosis not present

## 2023-12-12 DIAGNOSIS — F0154 Vascular dementia, unspecified severity, with anxiety: Secondary | ICD-10-CM | POA: Diagnosis not present

## 2023-12-12 DIAGNOSIS — D631 Anemia in chronic kidney disease: Secondary | ICD-10-CM | POA: Diagnosis not present

## 2023-12-17 DIAGNOSIS — E785 Hyperlipidemia, unspecified: Secondary | ICD-10-CM | POA: Diagnosis not present

## 2023-12-17 DIAGNOSIS — I10 Essential (primary) hypertension: Secondary | ICD-10-CM | POA: Diagnosis not present

## 2023-12-17 DIAGNOSIS — K219 Gastro-esophageal reflux disease without esophagitis: Secondary | ICD-10-CM | POA: Diagnosis not present

## 2023-12-17 DIAGNOSIS — D631 Anemia in chronic kidney disease: Secondary | ICD-10-CM | POA: Diagnosis not present

## 2023-12-17 DIAGNOSIS — N1832 Chronic kidney disease, stage 3b: Secondary | ICD-10-CM | POA: Diagnosis not present

## 2023-12-17 DIAGNOSIS — F0154 Vascular dementia, unspecified severity, with anxiety: Secondary | ICD-10-CM | POA: Diagnosis not present

## 2023-12-17 DIAGNOSIS — Z950 Presence of cardiac pacemaker: Secondary | ICD-10-CM | POA: Diagnosis not present

## 2024-01-14 ENCOUNTER — Encounter: Payer: Self-pay | Admitting: Dermatology

## 2024-01-14 ENCOUNTER — Encounter: Payer: HMO | Admitting: Dermatology

## 2024-01-14 ENCOUNTER — Ambulatory Visit (INDEPENDENT_AMBULATORY_CARE_PROVIDER_SITE_OTHER): Payer: HMO | Admitting: Dermatology

## 2024-01-14 DIAGNOSIS — L905 Scar conditions and fibrosis of skin: Secondary | ICD-10-CM | POA: Diagnosis not present

## 2024-01-14 DIAGNOSIS — T148XXA Other injury of unspecified body region, initial encounter: Secondary | ICD-10-CM

## 2024-01-14 DIAGNOSIS — W908XXA Exposure to other nonionizing radiation, initial encounter: Secondary | ICD-10-CM | POA: Diagnosis not present

## 2024-01-14 DIAGNOSIS — D1801 Hemangioma of skin and subcutaneous tissue: Secondary | ICD-10-CM | POA: Diagnosis not present

## 2024-01-14 DIAGNOSIS — D229 Melanocytic nevi, unspecified: Secondary | ICD-10-CM

## 2024-01-14 DIAGNOSIS — L814 Other melanin hyperpigmentation: Secondary | ICD-10-CM | POA: Diagnosis not present

## 2024-01-14 DIAGNOSIS — Z85828 Personal history of other malignant neoplasm of skin: Secondary | ICD-10-CM

## 2024-01-14 DIAGNOSIS — W5503XA Scratched by cat, initial encounter: Secondary | ICD-10-CM | POA: Diagnosis not present

## 2024-01-14 DIAGNOSIS — L578 Other skin changes due to chronic exposure to nonionizing radiation: Secondary | ICD-10-CM

## 2024-01-14 DIAGNOSIS — S80811A Abrasion, right lower leg, initial encounter: Secondary | ICD-10-CM | POA: Diagnosis not present

## 2024-01-14 DIAGNOSIS — L821 Other seborrheic keratosis: Secondary | ICD-10-CM

## 2024-01-14 DIAGNOSIS — Z1283 Encounter for screening for malignant neoplasm of skin: Secondary | ICD-10-CM | POA: Diagnosis not present

## 2024-01-14 NOTE — Patient Instructions (Signed)

## 2024-01-14 NOTE — Progress Notes (Signed)
   Follow-Up Visit   Subjective  Michele Montgomery is a 83 y.o. female who presents for the following: Skin Cancer Screening and Full Body Skin Exam. Hx of BCC.   The patient presents for Total-Body Skin Exam (TBSE) for skin cancer screening and mole check. The patient has spots, moles and lesions to be evaluated, some may be new or changing and the patient may have concern these could be cancer.    The following portions of the chart were reviewed this encounter and updated as appropriate: medications, allergies, medical history  Review of Systems:  No other skin or systemic complaints except as noted in HPI or Assessment and Plan.  Objective  Well appearing patient in no apparent distress; mood and affect are within normal limits.  A full examination was performed including scalp, head, eyes, ears, nose, lips, neck, chest, axillae, abdomen, back, buttocks, bilateral upper extremities, bilateral lower extremities, hands, feet, fingers, toes, fingernails, and toenails. All findings within normal limits unless otherwise noted below.   Relevant physical exam findings are noted in the Assessment and Plan.    Assessment & Plan   SKIN CANCER SCREENING PERFORMED TODAY.  HISTORY OF BASAL CELL CARCINOMA OF THE SKIN. Left nasal tip. Mohs 02/16/2021.  - No evidence of recurrence today - Recommend regular full body skin exams - Recommend daily broad spectrum sunscreen SPF 30+ to sun-exposed areas, reapply every 2 hours as needed.  - Call if any new or changing lesions are noted between office visits   ACTINIC DAMAGE - Chronic condition, secondary to cumulative UV/sun exposure - diffuse scaly erythematous macules with underlying dyspigmentation - Recommend daily broad spectrum sunscreen SPF 30+ to sun-exposed areas, reapply every 2 hours as needed.  - Staying in the shade or wearing long sleeves, sun glasses (UVA+UVB protection) and wide brim hats (4-inch brim around the entire circumference  of the hat) are also recommended for sun protection.  - Call for new or changing lesions.  LENTIGINES, SEBORRHEIC KERATOSES, HEMANGIOMAS - Benign normal skin lesions - Benign-appearing - Call for any changes  MELANOCYTIC NEVI - Tan-brown and/or pink-flesh-colored symmetric macules and papules - Benign appearing on exam today - Observation - Call clinic for new or changing moles - Recommend daily use of broad spectrum spf 30+ sunscreen to sun-exposed areas.   SCAR Exam: Dyspigmented smooth macule or patch at left pretibial, consistent with healed ulceration. Benign-appearing.  Observation.  Call clinic for new or changing lesions. Recommend daily broad spectrum sunscreen SPF 30+, reapply every 2 hours as needed. Treatment: Recommend Serica moisturizing scar formula cream every night or [facility] brand or Mederma silicone scar sheet every night for the first year after a scar appears to help with scar remodeling if desired. Scars remodel on their own for a full year and will gradually improve in appearance over time.   EXCORIATION Exam: Crusted excoriations at right lower leg, secondary to cat scratches.   Treatment Plan: Recommend Neosporin bid. Call if not resolving.   SEBORRHEIC KERATOSIS - 3 mm pink tan keratotic papule at cutaneous left upper lip. Recheck at follow up appointment.  - Benign-appearing - Discussed benign etiology and prognosis. - Observe - Call for any changes   Return in about 1 year (around 01/13/2025) for TBSE, HxBCC.  I, Lawson Radar, CMA, am acting as scribe for Willeen Niece, MD.   Documentation: I have reviewed the above documentation for accuracy and completeness, and I agree with the above.  Willeen Niece, MD

## 2024-01-16 DIAGNOSIS — N183 Chronic kidney disease, stage 3 unspecified: Secondary | ICD-10-CM | POA: Diagnosis not present

## 2024-01-16 DIAGNOSIS — E538 Deficiency of other specified B group vitamins: Secondary | ICD-10-CM | POA: Diagnosis not present

## 2024-01-16 DIAGNOSIS — N1832 Chronic kidney disease, stage 3b: Secondary | ICD-10-CM | POA: Diagnosis not present

## 2024-01-16 DIAGNOSIS — Z79899 Other long term (current) drug therapy: Secondary | ICD-10-CM | POA: Diagnosis not present

## 2024-01-16 DIAGNOSIS — I10 Essential (primary) hypertension: Secondary | ICD-10-CM | POA: Diagnosis not present

## 2024-01-16 DIAGNOSIS — D649 Anemia, unspecified: Secondary | ICD-10-CM | POA: Diagnosis not present

## 2024-01-16 DIAGNOSIS — Z0000 Encounter for general adult medical examination without abnormal findings: Secondary | ICD-10-CM | POA: Diagnosis not present

## 2024-01-16 DIAGNOSIS — E782 Mixed hyperlipidemia: Secondary | ICD-10-CM | POA: Diagnosis not present

## 2024-02-18 DIAGNOSIS — I420 Dilated cardiomyopathy: Secondary | ICD-10-CM | POA: Diagnosis not present

## 2024-04-22 DIAGNOSIS — Z961 Presence of intraocular lens: Secondary | ICD-10-CM | POA: Diagnosis not present

## 2024-04-22 DIAGNOSIS — H40059 Ocular hypertension, unspecified eye: Secondary | ICD-10-CM | POA: Diagnosis not present

## 2024-05-07 DIAGNOSIS — L03012 Cellulitis of left finger: Secondary | ICD-10-CM | POA: Diagnosis not present

## 2024-05-12 DIAGNOSIS — M7918 Myalgia, other site: Secondary | ICD-10-CM | POA: Diagnosis not present

## 2024-05-14 DIAGNOSIS — L600 Ingrowing nail: Secondary | ICD-10-CM | POA: Diagnosis not present

## 2024-05-19 DIAGNOSIS — I420 Dilated cardiomyopathy: Secondary | ICD-10-CM | POA: Diagnosis not present

## 2024-06-08 DIAGNOSIS — M545 Low back pain, unspecified: Secondary | ICD-10-CM | POA: Diagnosis not present

## 2024-06-08 DIAGNOSIS — G8929 Other chronic pain: Secondary | ICD-10-CM | POA: Diagnosis not present

## 2024-06-10 DIAGNOSIS — M545 Low back pain, unspecified: Secondary | ICD-10-CM | POA: Diagnosis not present

## 2024-06-10 DIAGNOSIS — G8929 Other chronic pain: Secondary | ICD-10-CM | POA: Diagnosis not present

## 2024-06-14 DIAGNOSIS — R197 Diarrhea, unspecified: Secondary | ICD-10-CM | POA: Diagnosis not present

## 2024-06-14 DIAGNOSIS — R112 Nausea with vomiting, unspecified: Secondary | ICD-10-CM | POA: Diagnosis not present

## 2024-06-14 DIAGNOSIS — K5289 Other specified noninfective gastroenteritis and colitis: Secondary | ICD-10-CM | POA: Diagnosis not present

## 2024-06-16 DIAGNOSIS — N183 Chronic kidney disease, stage 3 unspecified: Secondary | ICD-10-CM | POA: Diagnosis not present

## 2024-06-16 DIAGNOSIS — K529 Noninfective gastroenteritis and colitis, unspecified: Secondary | ICD-10-CM | POA: Diagnosis not present

## 2024-06-19 DIAGNOSIS — Z95810 Presence of automatic (implantable) cardiac defibrillator: Secondary | ICD-10-CM | POA: Diagnosis not present

## 2024-06-19 DIAGNOSIS — N183 Chronic kidney disease, stage 3 unspecified: Secondary | ICD-10-CM | POA: Diagnosis not present

## 2024-06-19 DIAGNOSIS — E782 Mixed hyperlipidemia: Secondary | ICD-10-CM | POA: Diagnosis not present

## 2024-06-19 DIAGNOSIS — I420 Dilated cardiomyopathy: Secondary | ICD-10-CM | POA: Diagnosis not present

## 2024-06-19 DIAGNOSIS — Z9889 Other specified postprocedural states: Secondary | ICD-10-CM | POA: Diagnosis not present

## 2024-06-19 DIAGNOSIS — Z950 Presence of cardiac pacemaker: Secondary | ICD-10-CM | POA: Diagnosis not present

## 2024-06-19 DIAGNOSIS — I495 Sick sinus syndrome: Secondary | ICD-10-CM | POA: Diagnosis not present

## 2024-06-19 DIAGNOSIS — I10 Essential (primary) hypertension: Secondary | ICD-10-CM | POA: Diagnosis not present

## 2024-06-25 DIAGNOSIS — Z950 Presence of cardiac pacemaker: Secondary | ICD-10-CM | POA: Diagnosis not present

## 2024-06-25 DIAGNOSIS — K219 Gastro-esophageal reflux disease without esophagitis: Secondary | ICD-10-CM | POA: Diagnosis not present

## 2024-06-25 DIAGNOSIS — F0154 Vascular dementia, unspecified severity, with anxiety: Secondary | ICD-10-CM | POA: Diagnosis not present

## 2024-06-25 DIAGNOSIS — N1832 Chronic kidney disease, stage 3b: Secondary | ICD-10-CM | POA: Diagnosis not present

## 2024-06-25 DIAGNOSIS — E785 Hyperlipidemia, unspecified: Secondary | ICD-10-CM | POA: Diagnosis not present

## 2024-06-25 DIAGNOSIS — D631 Anemia in chronic kidney disease: Secondary | ICD-10-CM | POA: Diagnosis not present

## 2024-06-25 DIAGNOSIS — I10 Essential (primary) hypertension: Secondary | ICD-10-CM | POA: Diagnosis not present

## 2024-06-26 ENCOUNTER — Inpatient Hospital Stay
Admission: EM | Admit: 2024-06-26 | Discharge: 2024-07-02 | DRG: 603 | Disposition: A | Discharge status: Home Health | Attending: Student | Admitting: Student

## 2024-06-26 ENCOUNTER — Other Ambulatory Visit: Payer: Self-pay

## 2024-06-26 ENCOUNTER — Encounter: Payer: Self-pay | Admitting: Intensive Care

## 2024-06-26 DIAGNOSIS — Z85828 Personal history of other malignant neoplasm of skin: Secondary | ICD-10-CM | POA: Diagnosis not present

## 2024-06-26 DIAGNOSIS — Z7982 Long term (current) use of aspirin: Secondary | ICD-10-CM | POA: Diagnosis not present

## 2024-06-26 DIAGNOSIS — Z881 Allergy status to other antibiotic agents status: Secondary | ICD-10-CM | POA: Diagnosis not present

## 2024-06-26 DIAGNOSIS — H9193 Unspecified hearing loss, bilateral: Secondary | ICD-10-CM | POA: Diagnosis present

## 2024-06-26 DIAGNOSIS — N1832 Chronic kidney disease, stage 3b: Secondary | ICD-10-CM | POA: Diagnosis present

## 2024-06-26 DIAGNOSIS — Z974 Presence of external hearing-aid: Secondary | ICD-10-CM

## 2024-06-26 DIAGNOSIS — L03114 Cellulitis of left upper limb: Secondary | ICD-10-CM | POA: Diagnosis not present

## 2024-06-26 DIAGNOSIS — M7989 Other specified soft tissue disorders: Secondary | ICD-10-CM | POA: Diagnosis not present

## 2024-06-26 DIAGNOSIS — E871 Hypo-osmolality and hyponatremia: Secondary | ICD-10-CM | POA: Diagnosis present

## 2024-06-26 DIAGNOSIS — Z79899 Other long term (current) drug therapy: Secondary | ICD-10-CM | POA: Diagnosis not present

## 2024-06-26 DIAGNOSIS — E876 Hypokalemia: Secondary | ICD-10-CM | POA: Diagnosis present

## 2024-06-26 DIAGNOSIS — W5501XA Bitten by cat, initial encounter: Secondary | ICD-10-CM

## 2024-06-26 DIAGNOSIS — I129 Hypertensive chronic kidney disease with stage 1 through stage 4 chronic kidney disease, or unspecified chronic kidney disease: Secondary | ICD-10-CM | POA: Diagnosis not present

## 2024-06-26 DIAGNOSIS — L539 Erythematous condition, unspecified: Secondary | ICD-10-CM | POA: Diagnosis not present

## 2024-06-26 DIAGNOSIS — Z888 Allergy status to other drugs, medicaments and biological substances status: Secondary | ICD-10-CM | POA: Diagnosis not present

## 2024-06-26 DIAGNOSIS — Z880 Allergy status to penicillin: Secondary | ICD-10-CM | POA: Diagnosis not present

## 2024-06-26 DIAGNOSIS — I420 Dilated cardiomyopathy: Secondary | ICD-10-CM | POA: Diagnosis present

## 2024-06-26 DIAGNOSIS — M1990 Unspecified osteoarthritis, unspecified site: Secondary | ICD-10-CM | POA: Diagnosis not present

## 2024-06-26 DIAGNOSIS — Z95810 Presence of automatic (implantable) cardiac defibrillator: Secondary | ICD-10-CM

## 2024-06-26 DIAGNOSIS — L0390 Cellulitis, unspecified: Secondary | ICD-10-CM | POA: Diagnosis present

## 2024-06-26 DIAGNOSIS — S51852A Open bite of left forearm, initial encounter: Secondary | ICD-10-CM | POA: Diagnosis not present

## 2024-06-26 DIAGNOSIS — E785 Hyperlipidemia, unspecified: Secondary | ICD-10-CM | POA: Diagnosis present

## 2024-06-26 DIAGNOSIS — R54 Age-related physical debility: Secondary | ICD-10-CM | POA: Diagnosis not present

## 2024-06-26 LAB — CBC WITH DIFFERENTIAL/PLATELET
Abs Immature Granulocytes: 0.07 K/uL (ref 0.00–0.07)
Basophils Absolute: 0 K/uL (ref 0.0–0.1)
Basophils Relative: 0 %
Eosinophils Absolute: 0 K/uL (ref 0.0–0.5)
Eosinophils Relative: 0 %
HCT: 31.2 % — ABNORMAL LOW (ref 36.0–46.0)
Hemoglobin: 10.7 g/dL — ABNORMAL LOW (ref 12.0–15.0)
Immature Granulocytes: 1 %
Lymphocytes Relative: 5 %
Lymphs Abs: 0.7 K/uL (ref 0.7–4.0)
MCH: 31.8 pg (ref 26.0–34.0)
MCHC: 34.3 g/dL (ref 30.0–36.0)
MCV: 92.9 fL (ref 80.0–100.0)
Monocytes Absolute: 0.8 K/uL (ref 0.1–1.0)
Monocytes Relative: 7 %
Neutro Abs: 11.3 K/uL — ABNORMAL HIGH (ref 1.7–7.7)
Neutrophils Relative %: 87 %
Platelets: 185 K/uL (ref 150–400)
RBC: 3.36 MIL/uL — ABNORMAL LOW (ref 3.87–5.11)
RDW: 12.9 % (ref 11.5–15.5)
WBC: 12.9 K/uL — ABNORMAL HIGH (ref 4.0–10.5)
nRBC: 0 % (ref 0.0–0.2)

## 2024-06-26 LAB — COMPREHENSIVE METABOLIC PANEL WITH GFR
ALT: 12 U/L (ref 0–44)
AST: 20 U/L (ref 15–41)
Albumin: 3.7 g/dL (ref 3.5–5.0)
Alkaline Phosphatase: 35 U/L — ABNORMAL LOW (ref 38–126)
Anion gap: 9 (ref 5–15)
BUN: 11 mg/dL (ref 8–23)
CO2: 20 mmol/L — ABNORMAL LOW (ref 22–32)
Calcium: 8.3 mg/dL — ABNORMAL LOW (ref 8.9–10.3)
Chloride: 97 mmol/L — ABNORMAL LOW (ref 98–111)
Creatinine, Ser: 1.21 mg/dL — ABNORMAL HIGH (ref 0.44–1.00)
GFR, Estimated: 44 mL/min — ABNORMAL LOW (ref >60)
Glucose, Bld: 123 mg/dL — ABNORMAL HIGH (ref 70–99)
Potassium: 3.2 mmol/L — ABNORMAL LOW (ref 3.5–5.1)
Sodium: 126 mmol/L — ABNORMAL LOW (ref 135–145)
Total Bilirubin: 0.6 mg/dL (ref 0.0–1.2)
Total Protein: 6.7 g/dL (ref 6.5–8.1)

## 2024-06-26 LAB — LACTIC ACID, PLASMA
Lactic Acid, Venous: 1.6 mmol/L (ref 0.5–1.9)
Lactic Acid, Venous: 1.7 mmol/L (ref 0.5–1.9)

## 2024-06-26 MED ORDER — SENNOSIDES-DOCUSATE SODIUM 8.6-50 MG PO TABS
1.0000 | ORAL_TABLET | Freq: Every evening | ORAL | Status: DC | PRN
  Filled 2024-06-26: qty 1

## 2024-06-26 MED ORDER — ALBUTEROL SULFATE (2.5 MG/3ML) 0.083% IN NEBU
2.5000 mg | INHALATION_SOLUTION | RESPIRATORY_TRACT | Status: DC | PRN
  Filled 2024-06-26: qty 3

## 2024-06-26 MED ORDER — ONDANSETRON 4 MG PO TBDP
4.0000 mg | ORAL_TABLET | Freq: Three times a day (TID) | ORAL | Status: DC | PRN
  Administered 2024-06-26 – 2024-07-01 (×2): 4 mg via ORAL
  Filled 2024-06-26 (×2): qty 1

## 2024-06-26 MED ORDER — CARVEDILOL 25 MG PO TABS
25.0000 mg | ORAL_TABLET | Freq: Two times a day (BID) | ORAL | Status: DC
  Administered 2024-06-27 – 2024-07-02 (×12): 25 mg via ORAL
  Filled 2024-06-26 (×12): qty 1

## 2024-06-26 MED ORDER — RISAQUAD PO CAPS: 1.0000 | ORAL_CAPSULE | Freq: Every day | ORAL | Status: DC

## 2024-06-26 MED ORDER — RISAQUAD PO CAPS
1.0000 | ORAL_CAPSULE | Freq: Three times a day (TID) | ORAL | Status: DC
  Administered 2024-06-27 – 2024-07-02 (×17): 1 via ORAL
  Filled 2024-06-26 (×17): qty 1

## 2024-06-26 MED ORDER — FAMOTIDINE 20 MG PO TABS
20.0000 mg | ORAL_TABLET | Freq: Two times a day (BID) | ORAL | Status: DC
Start: 2024-06-26 — End: 2024-06-27
  Administered 2024-06-27 (×2): 20 mg via ORAL
  Filled 2024-06-26 (×2): qty 1

## 2024-06-26 MED ORDER — SIMVASTATIN 20 MG PO TABS
40.0000 mg | ORAL_TABLET | Freq: Every day | ORAL | Status: DC
  Administered 2024-06-27 – 2024-07-01 (×6): 40 mg via ORAL
  Filled 2024-06-26 (×6): qty 2

## 2024-06-26 MED ORDER — SODIUM CHLORIDE 0.9 % IV SOLN: INTRAVENOUS | Status: DC

## 2024-06-26 MED ORDER — CLINDAMYCIN PHOSPHATE 600 MG/50ML IV SOLN
600.0000 mg | Freq: Once | INTRAVENOUS | Status: AC
Start: 2024-06-26 — End: 2024-06-26
  Administered 2024-06-26: 600 mg via INTRAVENOUS
  Filled 2024-06-26: qty 50

## 2024-06-26 MED ORDER — VITAMIN B-12 1000 MCG PO TABS
1000.0000 ug | ORAL_TABLET | Freq: Every day | ORAL | Status: DC
  Administered 2024-06-27 – 2024-07-02 (×5): 1000 ug via ORAL
  Filled 2024-06-26 (×6): qty 1

## 2024-06-26 MED ORDER — ENOXAPARIN SODIUM 40 MG/0.4ML IJ SOSY
40.0000 mg | PREFILLED_SYRINGE | INTRAMUSCULAR | Status: DC
  Administered 2024-06-27: 40 mg via SUBCUTANEOUS
  Filled 2024-06-26: qty 0.4

## 2024-06-26 MED ORDER — OXYCODONE HCL 5 MG PO TABS: 5.0000 mg | ORAL_TABLET | ORAL | Status: DC | PRN

## 2024-06-26 MED ORDER — POTASSIUM CHLORIDE CRYS ER 20 MEQ PO TBCR
20.0000 meq | EXTENDED_RELEASE_TABLET | ORAL | Status: AC
  Administered 2024-06-27 (×3): 20 meq via ORAL
  Filled 2024-06-26 (×3): qty 1

## 2024-06-26 MED ORDER — ASPIRIN 81 MG PO TBEC
81.0000 mg | DELAYED_RELEASE_TABLET | Freq: Every day | ORAL | Status: DC
  Administered 2024-06-27 – 2024-07-01 (×6): 81 mg via ORAL
  Filled 2024-06-26 (×6): qty 1

## 2024-06-26 MED ORDER — CLINDAMYCIN PHOSPHATE 600 MG/50ML IV SOLN
600.0000 mg | Freq: Two times a day (BID) | INTRAVENOUS | Status: DC
  Administered 2024-06-27: 600 mg via INTRAVENOUS
  Filled 2024-06-26: qty 50

## 2024-06-26 MED ORDER — HYDRALAZINE HCL 20 MG/ML IJ SOLN
5.0000 mg | Freq: Four times a day (QID) | INTRAMUSCULAR | Status: DC | PRN
  Administered 2024-06-30 – 2024-07-01 (×2): 5 mg via INTRAVENOUS
  Filled 2024-06-26 (×2): qty 1

## 2024-06-26 MED ORDER — CYCLOBENZAPRINE HCL 10 MG PO TABS: 5.0000 mg | ORAL_TABLET | Freq: Two times a day (BID) | ORAL | Status: DC | PRN

## 2024-06-26 MED ORDER — PANTOPRAZOLE SODIUM 40 MG PO TBEC
40.0000 mg | DELAYED_RELEASE_TABLET | Freq: Every day | ORAL | Status: DC
  Administered 2024-06-27 – 2024-07-02 (×6): 40 mg via ORAL
  Filled 2024-06-26 (×6): qty 1

## 2024-06-26 MED ORDER — LINEZOLID 600 MG/300ML IV SOLN: 600.0000 mg | Freq: Two times a day (BID) | INTRAVENOUS | Status: DC

## 2024-06-26 MED ORDER — SODIUM CHLORIDE 0.9 % IV SOLN: Freq: Once | INTRAVENOUS | Status: AC

## 2024-06-26 MED ORDER — LORAZEPAM 1 MG PO TABS
1.0000 mg | ORAL_TABLET | Freq: Every day | ORAL | Status: DC
Start: 2024-06-26 — End: 2024-06-28
  Administered 2024-06-27 (×2): 1 mg via ORAL
  Filled 2024-06-26 (×2): qty 1

## 2024-06-26 MED ORDER — RAMIPRIL 5 MG PO CAPS
10.0000 mg | ORAL_CAPSULE | Freq: Every day | ORAL | Status: DC
Start: 2024-06-27 — End: 2024-06-30
  Administered 2024-06-27 – 2024-06-29 (×3): 10 mg via ORAL
  Filled 2024-06-26 (×4): qty 2

## 2024-06-26 NOTE — H&P (Addendum)
 History and Physical    Patient: Michele Montgomery FMW:[id number] DOB: 08-23-1941 DOA: 06/26/2024 DOS: the patient was seen and examined on 06/26/2024 PCP: Auston Reyes BIRCH, MD  Patient coming from: Home  Chief Complaint:  Chief Complaint  Patient presents with   Animal Bite   HPI: Michele Montgomery is a 83 y.o. female with medical history significant of dilated cardiomyopathy status post PPM in 2011 last interrogated in May 2025, last documented ejection fraction was 55% on stress echo in 11/2023.  Hypertension, hyperlipidemia and chronic kidney disease stage IIIa  Patient has been in her usual state of health until couple of days ago when she was bitten by her cat.  Over the past couple of days, site of bite has evolved into swelling and significant erythema.  She reports, cat is up-to-date with vaccinations.  She denies any loss of function in her left upper extremity.  She has pain that she rates a 3/10.  Denied any fever chills nausea vomiting.  She was brought in upon insistence of family to be further evaluated.  Left upper extremity findings consistent with possible cellulitis. Per daughter, patient recently developed symptoms of viral gastroenteritis with vomiting and diarrhea about a week prior that self resolved.  Patient was initiated on empiric antibiotics in the ED.  There has been limitations with choice of antibiotics due to previously documented allergies to penicillin, clarithromycin  and ciprofloxacin.  Patient was thus initiated on antibiotics with clindamycin .  Patient was referred to hospitalist service for admission  Review of Systems: As mentioned in the history of present illness. All other systems reviewed and are negative. Past Medical History:  Diagnosis Date   AICD (automatic cardioverter/defibrillator) present    2011   Anemia    Arthritis    osteo   Basal cell carcinoma 12/20/2020   L nasal tip, MOHs 02/16/21   Colitis    Depression    Dilated cardiomyopathy  (HCC)    Diverticulitis    Dysrhythmia    Enlarged heart    Fracture    LEFT ANKLE/ WEARING BRACE   GERD (gastroesophageal reflux disease)    Hearing aid worn    Hyperlipidemia    Hypertension    Osteoporosis    Ovarian cyst    Spinal stenosis    Thyromegaly    Venous stasis    Past Surgical History:  Procedure Laterality Date   CARDIAC DEFIBRILLATOR PLACEMENT     CARDIAC DEFIBRILLATOR PLACEMENT     CATARACT EXTRACTION W/PHACO Left 03/05/2016   Procedure: CATARACT EXTRACTION PHACO AND INTRAOCULAR LENS PLACEMENT (IOC);  Surgeon: Steven Dingeldein, MD;  Location: [facility] ORS;  Service: Ophthalmology;  Laterality: Left;  US  00:58 AP% 23.9 CDE 24.51 fluid pack lot # [id number]   CATARACT EXTRACTION W/PHACO Right 07/25/2016   Procedure: CATARACT EXTRACTION PHACO AND INTRAOCULAR LENS PLACEMENT (IOC);  Surgeon: Steven Dingeldein, MD;  Location: [facility] ORS;  Service: Ophthalmology;  Laterality: Right;  US  1.05 AP% 20.4 CDE 21.59 fluid pack lot # [id number]   COLONOSCOPY WITH PROPOFOL  N/A 08/01/2015   Procedure: COLONOSCOPY WITH PROPOFOL ;  Surgeon: Donnice Vaughn Manes, MD;  Location: [facility] ENDOSCOPY;  Service: Endoscopy;  Laterality: N/A;   ESOPHAGOGASTRODUODENOSCOPY (EGD) WITH PROPOFOL  N/A 08/01/2015   Procedure: ESOPHAGOGASTRODUODENOSCOPY (EGD) WITH PROPOFOL ;  Surgeon: Donnice Vaughn Manes, MD;  Location: [facility] ENDOSCOPY;  Service: Endoscopy;  Laterality: N/A;   IMPLANTABLE CARDIOVERTER DEFIBRILLATOR (ICD) GENERATOR CHANGE Left 01/09/2017   Procedure: permanent biventricular pacemaker and implantable cardioverter defibrillator;  Surgeon: Marsa Dooms, MD;  Location: [facility] ORS;  Service: Cardiovascular;  Laterality: Left;   PACEMAKER PLACEMENT     Social History:  reports that she has never smoked. She has never used smokeless tobacco. She reports that she does not drink alcohol and does not use drugs.  Allergies  Allergen Reactions   Clarithromycin      Other reaction(s): Abdominal Pain    Penicillins Anaphylaxis    Has patient had a PCN reaction causing immediate rash, facial/tongue/throat swelling, SOB or lightheadedness with hypotension: Yes Has patient had a PCN reaction causing severe rash involving mucus membranes or skin necrosis: No Has patient had a PCN reaction that required hospitalization: Yes Has patient had a PCN reaction occurring within the last 10 years: No If all of the above answers are NO, then may proceed with Cephalosporin use.    Ciprofloxacin Nausea And Vomiting   Hydrochlorothiazide Nausea And Vomiting   Metronidazole Nausea And Vomiting   Other     METROIGOAZOLE BENZOATE   ?? Needs clarification.    Prednisone Hives    Family History  Problem Relation Age of Onset   Breast cancer Neg Hx     Prior to Admission medications   Medication Sig Start Date End Date Taking? Authorizing Provider  aspirin  81 MG tablet Take 1 tablet by mouth at bedtime.  11/25/09   [provider]  carvedilol  (COREG ) 25 MG tablet Take 25 mg by mouth 2 (two) times daily.  04/02/15   [provider]  cetirizine  (ZYRTEC ) 10 MG tablet Take 10 mg by mouth at bedtime.     [provider]  Cholecalciferol (VITAMIN D3) 5000 units CAPS Take 5,000 Units by mouth daily.    [provider]  citalopram (CELEXA) 20 MG tablet Take 20 mg by mouth at bedtime.     [provider]  clarithromycin  (BIAXIN ) 250 MG tablet Take 1 tablet (250 mg total) by mouth 2 (two) times daily. 01/09/17   Paraschos, Alexander, MD  clarithromycin  (BIAXIN ) 250 MG tablet Take 1 tablet (250 mg total) by mouth 2 (two) times daily. 01/09/17   Paraschos, Alexander, MD  cyclobenzaprine  (FLEXERIL ) 5 MG tablet Take 5 mg by mouth 2 (two) times daily as needed for muscle spasms. 06/19/16   [provider]  famotidine  (PEPCID ) 20 MG tablet Take 1 tablet (20 mg total) by mouth 2 (two) times daily. 08/29/19   Viviann Pastor, MD  IBU 800 MG tablet Take 800 mg by mouth 2  (two) times daily as needed for pain. 12/13/16   [provider]  LORazepam  (ATIVAN ) 0.5 MG tablet Take 1 mg by mouth at bedtime.  06/09/15   [provider]  mupirocin  ointment (BACTROBAN ) 2 % Apply 1 Application topically daily. Qd to wound on leg and cover with bandage 06/04/22   Jackquline Sawyer, MD  ondansetron  (ZOFRAN  ODT) 4 MG disintegrating tablet Take 1 tablet (4 mg total) by mouth every 8 (eight) hours as needed for nausea or vomiting. 08/29/19   Viviann Pastor, MD  pantoprazole  (PROTONIX ) 40 MG tablet Take 1 tablet by mouth daily. 03/29/15   [provider]  Probiotic Product (ALIGN PO) Take 1 tablet by mouth at bedtime.    [provider]  ramipril  (ALTACE ) 10 MG capsule Take 10 mg by mouth daily.  03/29/15   [provider]  simvastatin  (ZOCOR ) 40 MG tablet Take 40 mg by mouth at bedtime.  04/03/15   [provider]  vitamin B-12 (CYANOCOBALAMIN ) 1000 MCG tablet Take 1,000 mcg by  mouth daily.    [provider]    Physical Exam: Vitals:   06/26/24 1857 06/26/24 2148 06/26/24 2206  BP: 132/71 139/71 (!) 148/65  Pulse: 80 80 80  Resp: 16 18 18   Temp: 98.6 F (37 C) 98 F (36.7 C) 98.2 F (36.8 C)  TempSrc: Oral Oral Oral  SpO2: 97% 97% 100%  Weight: 64 kg  66.8 kg  Height: 4' 8 (1.422 m)  4' 8 (1.422 m)   General: Patient is a frail but pleasant and well-looking female.  Laying comfortably in bed not in any acute distress HEENT head is atraumatic, not pale no jaundice. Neck: Supple with no JVD Chest: Clinically clear Abdomen soft nontender with no organomegaly Cardiovascular: Left upper chest pacemaker present.  No murmur appreciated. Skin significant for left upper extremity swelling and erythema.  No significant tenderness or crepitus appreciated.  No foreign body identified.  Data Reviewed: Sodium is 126, potassium 3.2, chloride is 97, bicarb is 20, glucose is 123, BUN is 11, creatinine 1.21, calcium 8.3, AST  20, ALT 12 eGFR is 44, WBC is 12.9, hemoglobin is 10.7, hematocrit 31.2, platelet count is 185, neutrophil count 87%   Assessment and Plan: 83 year old pleasant female presents with left upper extremity cellulitis secondary to cat bite.  # Acute left upper extremity cellulitis: Secondary to cat bite.  Most common causative organism may include Pasteurella.  Augmentin would have been preferable however due to documented allergy to penicillin, second line treatment will be initiated with clindamycin  with caution.  Duplex ultrasound of the left upper extremity will be ordered.  #  History of dilated cardiomyopathy.  Last ejection fraction was noted to be preserved at 55%.  Patient looks compensated.  Will continue goal-directed medical therapy with metoprolol and losartan  # Hyponatremia: Profound.  Likely due to poor solute intake.  Last documented sodium was 131 on 09/02.  Asymptomatic however.  Patient will be gently volume repleted with IV fluids including normal saline.  Will monitor serum sodium on current regimen.  Will send urine for electrolytes.  #Hypokalemia: Likely due to poor solute intake.  Potassium replacement therapy in place.  # Status post PPM: Asymptomatic at this time.  Will monitor on telemetry.  # VTE prophylaxis   Advance Care Planning:   Code Status: Full Code   Consults:   Family Communication: Daughter at bedside was updated.  Severity of Illness: The appropriate patient status for this patient is INPATIENT. Inpatient status is judged to be reasonable and necessary in order to provide the required intensity of service to ensure the patient's safety. The patient's presenting symptoms, physical exam findings, and initial radiographic and laboratory data in the context of their chronic comorbidities is felt to place them at high risk for further clinical deterioration. Furthermore, it is not anticipated that the patient will be medically stable for discharge from the  hospital within 2 midnights of admission.   * I certify that at the point of admission it is my clinical judgment that the patient will require inpatient hospital care spanning beyond 2 midnights from the point of admission due to high intensity of service, high risk for further deterioration and high frequency of surveillance required.*  Author: Maude MARLA Dart, MD 06/26/2024 10:22 PM  For on call review [email or web address].

## 2024-06-26 NOTE — ED Provider Notes (Signed)
 [facility] Provider Note    Event Date/Time   First MD Initiated Contact with Patient 06/26/24 2054     (approximate)   History   Animal Bite   HPI  Michele Montgomery is a 83 y.o. female with PMH of hypertension, dilated cardiomyopathy with AICD who presents for evaluation of a cat bite on her left arm.  Patient states that this happened a couple days ago.  States her arm is red swollen and hot to touch.  She does have some pain at the bite site.  No fevers at home.  But does report feeling weak and fatigued.      Physical Exam   Triage Vital Signs: ED Triage Vitals [06/26/24 1857]  Encounter Vitals Group     BP 132/71     Girls Systolic BP Percentile      Girls Diastolic BP Percentile      Boys Systolic BP Percentile      Boys Diastolic BP Percentile      Pulse Rate 80     Resp 16     Temp 98.6 F (37 C)     Temp Source Oral     SpO2 97 %     Weight 141 lb (64 kg)     Height 4' 8 (1.422 m)     Head Circumference      Peak Flow      Pain Score 3     Pain Loc      Pain Education      Exclude from Growth Chart     Most recent vital signs: Vitals:   06/26/24 1857  BP: 132/71  Pulse: 80  Resp: 16  Temp: 98.6 F (37 C)  SpO2: 97%   General: Awake, no distress.  CV:  Good peripheral perfusion.  Resp:  Normal effort.  Abd:  No distention.  Other:  Left arm is erythematous, warm and swollen, small scab over initial bite site.         ED Results / Procedures / Treatments   Labs (all labs ordered are listed, but only abnormal results are displayed) Labs Reviewed  CBC WITH DIFFERENTIAL/PLATELET - Abnormal; Notable for the following components:      Result Value   WBC 12.9 (*)    RBC 3.36 (*)    Hemoglobin 10.7 (*)    HCT 31.2 (*)    Neutro Abs 11.3 (*)    All other components within normal limits  COMPREHENSIVE METABOLIC PANEL WITH GFR - Abnormal; Notable for the following components:   Sodium 126 (*)    Potassium 3.2  (*)    Chloride 97 (*)    CO2 20 (*)    Glucose, Bld 123 (*)    Creatinine, Ser 1.21 (*)    Calcium 8.3 (*)    Alkaline Phosphatase 35 (*)    GFR, Estimated 44 (*)    All other components within normal limits  CULTURE, BLOOD (ROUTINE X 2)  CULTURE, BLOOD (ROUTINE X 2)  LACTIC ACID, PLASMA  LACTIC ACID, PLASMA  BASIC METABOLIC PANEL WITH GFR  CBC    PROCEDURES:  Critical Care performed: No  Procedures   MEDICATIONS ORDERED IN ED: Medications  clindamycin  (CLEOCIN ) IVPB 600 mg (has no administration in time range)  0.9 %  sodium chloride  infusion (has no administration in time range)  aspirin  tablet 81 mg (has no administration in time range)  carvedilol  (COREG ) tablet 25 mg (has no administration in time range)  ramipril  (ALTACE ) capsule 10 mg (has no administration in time range)  simvastatin  (ZOCOR ) tablet 40 mg (has no administration in time range)  LORazepam  (ATIVAN ) tablet 1 mg (has no administration in time range)  famotidine  (PEPCID ) tablet 20 mg (has no administration in time range)  ondansetron  (ZOFRAN -ODT) disintegrating tablet 4 mg (has no administration in time range)  pantoprazole  (PROTONIX ) EC tablet 40 mg (has no administration in time range)  Align CAPS (has no administration in time range)  cyanocobalamin  (VITAMIN B12) tablet 1,000 mcg (has no administration in time range)  cyclobenzaprine  (FLEXERIL ) tablet 5 mg (has no administration in time range)  enoxaparin  (LOVENOX ) injection 40 mg (has no administration in time range)  0.9 %  sodium chloride  infusion (has no administration in time range)  oxyCODONE  (Oxy IR/ROXICODONE ) immediate release tablet 5 mg (has no administration in time range)  senna-docusate (Senokot-S) tablet 1 tablet (has no administration in time range)  albuterol  (PROVENTIL ) (2.5 MG/3ML) 0.083% nebulizer solution 2.5 mg (has no administration in time range)  hydrALAZINE  (APRESOLINE ) injection 5 mg (has no administration in time range)      IMPRESSION / MDM / ASSESSMENT AND PLAN / ED COURSE  I reviewed the triage vital signs and the nursing notes.                             83 year old female presents for evaluation of cat bite to the left arm.  Vital signs are stable patient NAD on exam.  Differential diagnosis includes, but is not limited to, cellulitis, abscess, folliculitis, erysipelas.  Patient's presentation is most consistent with acute complicated illness / injury requiring diagnostic workup.  CBC notable for leukocytosis and anemia.  CMP shows hyponatremia, hypokalemia, hypochloremia, elevated glucose, elevated creatinine and hypocalcemia.  Patient's left arm is erythematous, warm and swollen.  She has lymphatic streaking up to her left armpit.  Given the extent of the cellulitis and her electrolyte abnormalities, feel patient is most appropriate for inpatient admission. Feel that sepsis is unlikely given patient has normal vitals but will obtain cultures and lactic acid.   Patient updated on plan and is in agreement.  Clinical Course as of 06/26/24 2125  Fri Jun 26, 2024  2120 Spoke with hospitalist who is agreeable to admit. [LD]    Clinical Course User Index [LD] Cleaster Tinnie LABOR, PA-C     FINAL CLINICAL IMPRESSION(S) / ED DIAGNOSES   Final diagnoses:  Cellulitis of left upper extremity  Cat bite, initial encounter     Rx / DC Orders   ED Discharge Orders     None        Note:  This document was prepared using Dragon voice recognition software and may include unintentional dictation errors.   Cleaster Tinnie LABOR, PA-C 06/26/24 2125    Dorothyann Drivers, MD 06/26/24 2249

## 2024-06-26 NOTE — ED Provider Notes (Signed)
-----------------------------------------   9:14 PM on 06/26/2024 ----------------------------------------- I have personally seen and evaluated the patient in conjunction with the physician assistant.  Patient has a pretty significant amount of cellulitis to the left upper extremity with lymphatic streaking.  Was bit by her cat several days ago.  White blood cell count is elevated to 12,900, patient is mildly hyponatremic currently 126.  Will start the patient on IV fluids.  We will dose IV clindamycin  given the patient's extensive list of allergies which should have good cellulitis/Pasteurella coverage.  We will send blood cultures and a lactic acid.  Patient will require admission to the hospitalist service for cellulitis.   Dorothyann Drivers, MD 06/26/24 2115

## 2024-06-26 NOTE — ED Notes (Signed)
 See triage note. Left arm is erythematous and swollen from the elbow to the wrist. Pt mentioned allergies to multiple antibiotics as listed but is very concerned about penicillin. This nurse reassured her that the provider would not give her anything that she has had a reaction to in the past. Pt A&Ox4, NAD.

## 2024-06-26 NOTE — ED Triage Notes (Signed)
 Patient was bit by her cat a couple of days ago on left arm. Arm is red, swollen and hot to touch.   Patient has Facilities manager

## 2024-06-27 ENCOUNTER — Inpatient Hospital Stay

## 2024-06-27 DIAGNOSIS — L03114 Cellulitis of left upper limb: Secondary | ICD-10-CM | POA: Diagnosis not present

## 2024-06-27 LAB — BASIC METABOLIC PANEL WITH GFR
Anion gap: 11 (ref 5–15)
BUN: 13 mg/dL (ref 8–23)
CO2: 20 mmol/L — ABNORMAL LOW (ref 22–32)
Calcium: 7.9 mg/dL — ABNORMAL LOW (ref 8.9–10.3)
Chloride: 97 mmol/L — ABNORMAL LOW (ref 98–111)
Creatinine, Ser: 1.31 mg/dL — ABNORMAL HIGH (ref 0.44–1.00)
GFR, Estimated: 40 mL/min — ABNORMAL LOW (ref >60)
Glucose, Bld: 107 mg/dL — ABNORMAL HIGH (ref 70–99)
Potassium: 3.3 mmol/L — ABNORMAL LOW (ref 3.5–5.1)
Sodium: 128 mmol/L — ABNORMAL LOW (ref 135–145)

## 2024-06-27 LAB — C-REACTIVE PROTEIN: CRP: 6.7 mg/dL — ABNORMAL HIGH (ref <1.0)

## 2024-06-27 LAB — CBC
HCT: 28.3 % — ABNORMAL LOW (ref 36.0–46.0)
Hemoglobin: 9.6 g/dL — ABNORMAL LOW (ref 12.0–15.0)
MCH: 31.5 pg (ref 26.0–34.0)
MCHC: 33.9 g/dL (ref 30.0–36.0)
MCV: 92.8 fL (ref 80.0–100.0)
Platelets: 162 K/uL (ref 150–400)
RBC: 3.05 MIL/uL — ABNORMAL LOW (ref 3.87–5.11)
RDW: 13.2 % (ref 11.5–15.5)
WBC: 12.3 K/uL — ABNORMAL HIGH (ref 4.0–10.5)
nRBC: 0 % (ref 0.0–0.2)

## 2024-06-27 MED ORDER — ACETAMINOPHEN 325 MG PO TABS
650.0000 mg | ORAL_TABLET | Freq: Four times a day (QID) | ORAL | Status: DC | PRN
  Administered 2024-06-27 – 2024-06-29 (×3): 650 mg via ORAL
  Filled 2024-06-27 (×3): qty 2

## 2024-06-27 MED ORDER — ONDANSETRON HCL 4 MG/2ML IJ SOLN
4.0000 mg | Freq: Four times a day (QID) | INTRAMUSCULAR | Status: DC | PRN
  Administered 2024-06-27 – 2024-07-01 (×3): 4 mg via INTRAVENOUS
  Filled 2024-06-27 (×3): qty 2

## 2024-06-27 MED ORDER — ENOXAPARIN SODIUM 30 MG/0.3ML IJ SOSY
30.0000 mg | PREFILLED_SYRINGE | INTRAMUSCULAR | Status: DC
  Administered 2024-06-27 – 2024-07-01 (×5): 30 mg via SUBCUTANEOUS
  Filled 2024-06-27 (×5): qty 0.3

## 2024-06-27 MED ORDER — LEVOFLOXACIN 750 MG PO TABS
750.0000 mg | ORAL_TABLET | ORAL | Status: DC
  Administered 2024-06-27: 750 mg via ORAL
  Filled 2024-06-27 (×2): qty 1

## 2024-06-27 MED ORDER — SODIUM CHLORIDE 0.9 % IV SOLN
100.0000 mg | Freq: Two times a day (BID) | INTRAVENOUS | Status: DC
  Administered 2024-06-27 – 2024-06-29 (×4): 100 mg via INTRAVENOUS
  Filled 2024-06-27 (×5): qty 100

## 2024-06-27 NOTE — Plan of Care (Signed)

## 2024-06-27 NOTE — Plan of Care (Signed)
  Problem: Education: Goal: Knowledge of General Education information will improve Description: Including pain rating scale, medication(s)/side effects and non-pharmacologic comfort measures Outcome: Progressing   Problem: Health Behavior/Discharge Planning: Goal: Ability to manage health-related needs will improve Outcome: Progressing   Problem: Clinical Measurements: Goal: Ability to maintain clinical measurements within normal limits will improve Outcome: Progressing Goal: Will remain free from infection Outcome: Progressing Goal: Diagnostic test results will improve Outcome: Progressing Goal: Respiratory complications will improve Outcome: Progressing Goal: Cardiovascular complication will be avoided Outcome: Progressing   Problem: Activity: Goal: Risk for activity intolerance will decrease Outcome: Progressing   Problem: Nutrition: Goal: Adequate nutrition will be maintained Outcome: Progressing   Problem: Coping: Goal: Level of anxiety will decrease Outcome: Progressing   Problem: Elimination: Goal: Will not experience complications related to bowel motility Outcome: Progressing Goal: Will not experience complications related to urinary retention Outcome: Progressing   Problem: Pain Managment: Goal: General experience of comfort will improve and/or be controlled Outcome: Progressing   Problem: Safety: Goal: Ability to remain free from injury will improve Outcome: Progressing   Problem: Skin Integrity: Goal: Risk for impaired skin integrity will decrease Outcome: Progressing   Problem: Clinical Measurements: Goal: Ability to avoid or minimize complications of infection will improve Outcome: Progressing   Problem: Skin Integrity: Goal: Skin integrity will improve Outcome: Progressing   Problem: Education: Goal: Knowledge of General Education information will improve Description: Including pain rating scale, medication(s)/side effects and  non-pharmacologic comfort measures Outcome: Progressing   Problem: Health Behavior/Discharge Planning: Goal: Ability to manage health-related needs will improve Outcome: Progressing   Problem: Clinical Measurements: Goal: Ability to maintain clinical measurements within normal limits will improve Outcome: Progressing Goal: Will remain free from infection Outcome: Progressing Goal: Diagnostic test results will improve Outcome: Progressing Goal: Respiratory complications will improve Outcome: Progressing Goal: Cardiovascular complication will be avoided Outcome: Progressing   Problem: Activity: Goal: Risk for activity intolerance will decrease Outcome: Progressing   Problem: Nutrition: Goal: Adequate nutrition will be maintained Outcome: Progressing   Problem: Coping: Goal: Level of anxiety will decrease Outcome: Progressing   Problem: Elimination: Goal: Will not experience complications related to bowel motility Outcome: Progressing Goal: Will not experience complications related to urinary retention Outcome: Progressing   Problem: Pain Managment: Goal: General experience of comfort will improve and/or be controlled Outcome: Progressing   Problem: Safety: Goal: Ability to remain free from injury will improve Outcome: Progressing   Problem: Skin Integrity: Goal: Risk for impaired skin integrity will decrease Outcome: Progressing   Problem: Clinical Measurements: Goal: Ability to avoid or minimize complications of infection will improve Outcome: Progressing   Problem: Skin Integrity: Goal: Skin integrity will improve Outcome: Progressing

## 2024-06-27 NOTE — Progress Notes (Signed)
  PROGRESS NOTE    Michele Montgomery  FMW:[id number] DOB: 1940/10/19 DOA: 06/26/2024 PCP: Auston Reyes BIRCH, MD  145A/145A-AA  LOS: 1 day   Brief hospital course:   Assessment & Plan: Michele Montgomery is a 83 y.o. female with medical history significant of dilated cardiomyopathy status post PPM in 2011 last interrogated in May 2025, last documented ejection fraction was 55% on stress echo in 11/2023.  Hypertension, hyperlipidemia and chronic kidney disease stage IIIa   Patient has been in her usual state of health until couple of days ago when she was bitten by her cat.  Over the past couple of days, site of bite has evolved into swelling and significant erythema.    # Acute left upper extremity cellulitis 2/2 Cat bite --Most common causative organism includes Pasteurella.  Augmentin would have been preferable however due to documented allergy to penicillin, second line treatment was initiated with clindamycin . --discussed with pt and daughter, and they refused to challenge allergy with Augmentin. --after discussing with oncall ID provider, abx switched from clinda to doxy and levaquin .  # History of dilated cardiomyopathy # Status post PPM: Last ejection fraction was noted to be preserved at 55%.     # Hyponatremia:  Na 126 on presentation.  Likely due to poor solute intake.  Last documented sodium was 131 on 09/02.  Asymptomatic however.   --s/p gentle MIVF --monitor   #Hypokalemia:  --monitor and supplement PRN  # Diarrhea --started after starting IV clinda.  Pt reported having a hx of C diff. --obtain C diff test    DVT prophylaxis: Lovenox  SQ Code Status: Full code  Family Communication: daughter updated at bedside today Level of care: Med-Surg Dispo:   The patient is from: home Anticipated d/c is to: home Anticipated d/c date is: 2-3 days   Subjective and Interval History:  Pt had fever overnight.  Also started having diarrhea this morning.  Discussed with oncall  ID provider, and switch abx from clinda to doxy and levaquin .   Objective: Vitals:   06/27/24 0420 06/27/24 0719 06/27/24 1136 06/27/24 1529  BP:  (!) 105/52  106/78  Pulse:  61  63  Resp:  17  17  Temp: (!) 100.9 F (38.3 C) 98.1 F (36.7 C) 98.2 F (36.8 C) 98.9 F (37.2 C)  TempSrc: Oral Oral  Oral  SpO2:  94%  99%  Weight:      Height:        Intake/Output Summary (Last 24 hours) at 06/27/2024 1847 Last data filed at 06/27/2024 1600 Gross per 24 hour  Intake 792.28 ml  Output --  Net 792.28 ml   Filed Weights   06/26/24 1857 06/26/24 2206  Weight: 64 kg 66.8 kg    Examination:   Constitutional: NAD, AAOx3 HEENT: conjunctivae and lids normal, EOMI, hard of hearing CV: No cyanosis.   RESP: normal respiratory effort, on RA SKIN: erythema, edema and warmth over left forearm. Neuro: II - XII grossly intact.   Psych: Normal mood and affect.     Data Reviewed: I have personally reviewed labs and imaging studies  Time spent: 50 minutes  Michele Haber, MD [facility] If 7PM-7AM, please contact night-coverage 06/27/2024, 6:47 PM

## 2024-06-27 NOTE — Progress Notes (Signed)
 PHARMACIST - PHYSICIAN COMMUNICATION  CONCERNING:  Enoxaparin  (Lovenox ) for DVT Prophylaxis    RECOMMENDATION: Patient was prescribed enoxaprin 40mg  q24 hours for VTE prophylaxis.   Filed Weights   06/26/24 1857 06/26/24 2206  Weight: 64 kg (141 lb) 66.8 kg (147 lb 4.3 oz)    Body mass index is 33.02 kg/m.  Estimated Creatinine Clearance: 24 mL/min (A) (by C-G formula based on SCr of 1.31 mg/dL (H)).   Patient is candidate for enoxaparin  30mg  every 24 hours based on CrCl <50ml/min   DESCRIPTION: Pharmacy has adjusted enoxaparin  dose per [facility] policy.  Patient is now receiving enoxaparin  30 mg every 24 hours    Estill CHRISTELLA Lutes, PharmD, BCPS Clinical Pharmacist 06/27/2024 12:56 PM

## 2024-06-28 ENCOUNTER — Inpatient Hospital Stay

## 2024-06-28 DIAGNOSIS — L03114 Cellulitis of left upper limb: Secondary | ICD-10-CM | POA: Diagnosis not present

## 2024-06-28 LAB — CBC
HCT: 27.9 % — ABNORMAL LOW (ref 36.0–46.0)
Hemoglobin: 9.4 g/dL — ABNORMAL LOW (ref 12.0–15.0)
MCH: 31.5 pg (ref 26.0–34.0)
MCHC: 33.7 g/dL (ref 30.0–36.0)
MCV: 93.6 fL (ref 80.0–100.0)
Platelets: 143 K/uL — ABNORMAL LOW (ref 150–400)
RBC: 2.98 MIL/uL — ABNORMAL LOW (ref 3.87–5.11)
RDW: 13.1 % (ref 11.5–15.5)
WBC: 13 K/uL — ABNORMAL HIGH (ref 4.0–10.5)
nRBC: 0 % (ref 0.0–0.2)

## 2024-06-28 LAB — BASIC METABOLIC PANEL WITH GFR
Anion gap: 7 (ref 5–15)
BUN: 19 mg/dL (ref 8–23)
CO2: 20 mmol/L — ABNORMAL LOW (ref 22–32)
Calcium: 7.9 mg/dL — ABNORMAL LOW (ref 8.9–10.3)
Chloride: 96 mmol/L — ABNORMAL LOW (ref 98–111)
Creatinine, Ser: 1.57 mg/dL — ABNORMAL HIGH (ref 0.44–1.00)
GFR, Estimated: 32 mL/min — ABNORMAL LOW (ref >60)
Glucose, Bld: 105 mg/dL — ABNORMAL HIGH (ref 70–99)
Potassium: 3.9 mmol/L (ref 3.5–5.1)
Sodium: 123 mmol/L — ABNORMAL LOW (ref 135–145)

## 2024-06-28 LAB — C DIFFICILE QUICK SCREEN W PCR REFLEX
C Diff antigen: NEGATIVE
C Diff interpretation: NOT DETECTED
C Diff toxin: NEGATIVE

## 2024-06-28 LAB — MAGNESIUM: Magnesium: 1.7 mg/dL (ref 1.7–2.4)

## 2024-06-28 MED ORDER — SODIUM CHLORIDE 0.9 % IV SOLN: INTRAVENOUS | Status: AC

## 2024-06-28 MED ORDER — LORAZEPAM 1 MG PO TABS
1.0000 mg | ORAL_TABLET | Freq: Every day | ORAL | Status: DC
Start: 2024-06-28 — End: 2024-06-29
  Administered 2024-06-28: 1 mg via ORAL
  Filled 2024-06-28: qty 1

## 2024-06-28 MED ORDER — LOPERAMIDE HCL 2 MG PO CAPS
2.0000 mg | ORAL_CAPSULE | Freq: Every day | ORAL | Status: DC | PRN
  Administered 2024-06-28: 2 mg via ORAL
  Filled 2024-06-28: qty 1

## 2024-06-28 MED ORDER — CETIRIZINE HCL 5 MG/5ML PO SOLN
10.0000 mg | Freq: Every day | ORAL | Status: DC
  Administered 2024-06-28 – 2024-06-30 (×3): 10 mg via ORAL
  Filled 2024-06-28 (×5): qty 10

## 2024-06-28 NOTE — Progress Notes (Signed)
  PROGRESS NOTE    Michele Montgomery  FMW:[id number] DOB: August 19, 1941 DOA: 06/26/2024 PCP: Auston Reyes BIRCH, MD  145A/145A-AA  LOS: 2 days   Brief hospital course:   Assessment & Plan: Michele Montgomery is a 83 y.o. female with medical history significant of dilated cardiomyopathy status post PPM in 2011 last interrogated in May 2025, last documented ejection fraction was 55% on stress echo in 11/2023.  Hypertension, hyperlipidemia and chronic kidney disease stage IIIa   Patient has been in her usual state of health until couple of days ago when she was bitten by her cat.  Over the past couple of days, site of bite has evolved into swelling and significant erythema.    # Acute left upper extremity cellulitis 2/2 Cat bite --Most common causative organism includes Pasteurella.  Augmentin would have been preferable however due to documented allergy to penicillin, second line treatment was initiated with clindamycin . --discussed with pt and daughter, and they refused to challenge allergy with Augmentin. --after discussing with oncall ID provider, abx switched from clinda to doxy and levaquin . --cont IV doxy and oral Levaquin   # History of dilated cardiomyopathy # Status post PPM: Last ejection fraction was noted to be preserved at 55%.     # Hyponatremia:  Na 126 on presentation.  Likely due to poor solute intake.  Last documented sodium was 131 on 09/02.  Asymptomatic however.   --s/p gentle MIVF --monitor   #Hypokalemia:  --monitor and supplement PRN  # Diarrhea --started after starting IV clinda.  Pt reported having a hx of C diff. --current C diff neg --Imodium  PRN  # AKI on CKD 3b --Cr 1.21 on presentation, trended up to 1.57 this morning. --start gentle MIVF   DVT prophylaxis: Lovenox  SQ Code Status: Full code  Family Communication: daughter updated at bedside today Level of care: Med-Surg Dispo:   The patient is from: home Anticipated d/c is to: home Anticipated d/c  date is: 1-2 days   Subjective and Interval History:  No fever overnight.  No more diarrhea.    Objective: Vitals:   06/27/24 1952 06/28/24 0440 06/28/24 0817 06/28/24 1537  BP: (!) 100/54 126/68 (!) 157/67 (!) 144/72  Pulse: 65 65 66 67  Resp: 16 18 15 16   Temp: 98.1 F (36.7 C) 98.1 F (36.7 C) 97.9 F (36.6 C) 99 F (37.2 C)  TempSrc: Oral  Oral Oral  SpO2: 96% 97% 98% 100%  Weight:      Height:        Intake/Output Summary (Last 24 hours) at 06/28/2024 1747 Last data filed at 06/28/2024 1100 Gross per 24 hour  Intake 240 ml  Output --  Net 240 ml   Filed Weights   06/26/24 1857 06/26/24 2206  Weight: 64 kg 66.8 kg    Examination:   Constitutional: NAD, AAOx3 HEENT: conjunctivae and lids normal, EOMI CV: No cyanosis.   RESP: normal respiratory effort, on RA Extremities: No effusions, edema in BLE SKIN: erythema, edema improved over left forearm Neuro: II - XII grossly intact.     Data Reviewed: I have personally reviewed labs and imaging studies  Time spent: 50 minutes  Ellouise Haber, MD [facility] If 7PM-7AM, please contact night-coverage 06/28/2024, 5:47 PM

## 2024-06-28 NOTE — Plan of Care (Signed)

## 2024-06-29 DIAGNOSIS — L03114 Cellulitis of left upper limb: Secondary | ICD-10-CM | POA: Diagnosis not present

## 2024-06-29 LAB — CBC
HCT: 29.8 % — ABNORMAL LOW (ref 36.0–46.0)
Hemoglobin: 10 g/dL — ABNORMAL LOW (ref 12.0–15.0)
MCH: 31.4 pg (ref 26.0–34.0)
MCHC: 33.6 g/dL (ref 30.0–36.0)
MCV: 93.7 fL (ref 80.0–100.0)
Platelets: 150 K/uL (ref 150–400)
RBC: 3.18 MIL/uL — ABNORMAL LOW (ref 3.87–5.11)
RDW: 13 % (ref 11.5–15.5)
WBC: 10.2 K/uL (ref 4.0–10.5)
nRBC: 0 % (ref 0.0–0.2)

## 2024-06-29 LAB — BASIC METABOLIC PANEL WITH GFR
Anion gap: 11 (ref 5–15)
BUN: 19 mg/dL (ref 8–23)
CO2: 20 mmol/L — ABNORMAL LOW (ref 22–32)
Calcium: 8.3 mg/dL — ABNORMAL LOW (ref 8.9–10.3)
Chloride: 103 mmol/L (ref 98–111)
Creatinine, Ser: 1.3 mg/dL — ABNORMAL HIGH (ref 0.44–1.00)
GFR, Estimated: 40 mL/min — ABNORMAL LOW (ref >60)
Glucose, Bld: 104 mg/dL — ABNORMAL HIGH (ref 70–99)
Potassium: 3.7 mmol/L (ref 3.5–5.1)
Sodium: 134 mmol/L — ABNORMAL LOW (ref 135–145)

## 2024-06-29 LAB — MAGNESIUM: Magnesium: 2 mg/dL (ref 1.7–2.4)

## 2024-06-29 MED ORDER — LORAZEPAM 0.5 MG PO TABS
0.5000 mg | ORAL_TABLET | Freq: Every day | ORAL | Status: DC
  Administered 2024-06-29 – 2024-07-01 (×3): 0.5 mg via ORAL
  Filled 2024-06-29 (×3): qty 1

## 2024-06-29 MED ORDER — LEVOFLOXACIN 500 MG PO TABS
500.0000 mg | ORAL_TABLET | ORAL | Status: DC
  Administered 2024-06-29: 500 mg via ORAL
  Filled 2024-06-29 (×2): qty 1

## 2024-06-29 MED ORDER — DOXYCYCLINE HYCLATE 100 MG PO TABS
100.0000 mg | ORAL_TABLET | Freq: Two times a day (BID) | ORAL | Status: DC
  Administered 2024-06-29 – 2024-07-02 (×6): 100 mg via ORAL
  Filled 2024-06-29 (×6): qty 1

## 2024-06-29 MED ORDER — MELATONIN 5 MG PO TABS
5.0000 mg | ORAL_TABLET | Freq: Every evening | ORAL | Status: DC | PRN
  Administered 2024-06-29 – 2024-07-01 (×3): 5 mg via ORAL
  Filled 2024-06-29 (×3): qty 1

## 2024-06-29 NOTE — Consult Note (Addendum)
 WOC Nurse Consult Note: Reason for Consult: Consult requested for left arm cellulitis related to a cat bite.  Pt is already on systemic antibiotics.  Wound type: Left hand and lower arm is red and swollen; affected area is approx 20X10cm.  Large amt fluid palpated below the skin level, painful to touch. Small amt yellow drainage at present; expect skin will begin to blister and peel soon to release the fluid; then evolve into patchy areas of full thickness skin loss as the blisters slough off. This is a typical progression with cellulitis within several days.   Topical treatment orders provided for bedside nurses to perform as follows to decrease edema and promote drying and healing:  1. Change left arm dressings Q day as follows: Apply Xroform gauze over weeping areas of blisterd or peeling skin.  Cover with ABD pads and krelex 2.  Keep left arm elevated as much as possible 3. Pt may shower when at home without a dressing  Pt states her daughter will be able to help with dresing changes after discharge. Daughter is not present to discuss the plan of care.   Please re-consult if further assistance is needed.  Thank-you,  Stephane Fought MSN, RN, CWOCN, CWCN-AP, CNS Contact Mon-Fri 0700-1500: [phone]

## 2024-06-29 NOTE — Progress Notes (Signed)
 PHARMACIST - PHYSICIAN COMMUNICATION  CONCERNING: Antibiotic IV to Oral Route Change Policy  RECOMMENDATION: This patient is receiving doxycycline  by the intravenous route.  Based on criteria approved by the Pharmacy and Therapeutics Committee, the antibiotic(s) is/are being converted to the equivalent oral dose form(s).  DESCRIPTION: These criteria include: Patient being treated for a respiratory tract infection, urinary tract infection, cellulitis or clostridium difficile associated diarrhea if on metronidazole The patient is not neutropenic and does not exhibit a GI malabsorption state The patient is eating (either orally or via tube) and/or has been taking other orally administered medications for a least 24 hours The patient is improving clinically and has a Tmax < 100.5  If you have questions about this conversion, please contact the Pharmacy Department  []   [phone] )  Michele Montgomery [x]   [phone] )  [facility] []   [phone] )  Michele Montgomery []   [phone] )  Michele Montgomery []   [phone] )  [facility]    Thank you for involving pharmacy in this patient's care.   Damien Napoleon, PharmD Clinical Pharmacist 06/29/2024 2:31 PM

## 2024-06-29 NOTE — Care Management Important Message (Signed)
 Important Message  Patient Details  Name: Michele Montgomery MRN: [id number] Date of Birth: Oct 04, 1941   Important Message Given:  Yes - Medicare IM     Almond Fitzgibbon W, CMA 06/29/2024, 1:21 PM

## 2024-06-29 NOTE — Progress Notes (Signed)
 PROGRESS NOTE    Michele Montgomery  FMW:[id number] DOB: [birth year] DOA: 06/26/2024 PCP: Auston Reyes BIRCH, MD  145A/145A-AA  LOS: 3 days   Brief hospital course:   Assessment & Plan: Michele Montgomery is a 83 y.o. female with medical history significant of dilated cardiomyopathy status post PPM in 2011 last interrogated in May 2025, last documented ejection fraction was 55% on stress echo in 11/2023.  Hypertension, hyperlipidemia and chronic kidney disease stage IIIa   Patient has been in her usual state of health until couple of days ago when she was bitten by her cat.  Over the past couple of days, site of bite has evolved into swelling and significant erythema.    # Acute left upper extremity cellulitis 2/2 Cat bite --Most common causative organism includes Pasteurella.  Augmentin would have been preferable however due to documented allergy to penicillin, second line treatment was initiated with clindamycin . --discussed with pt and daughter, and they refused to challenge allergy with Augmentin. --after discussing with oncall ID provider, abx switched from clinda to doxy and levaquin . --Large amt fluid palpated below the skin level today.  Per wound care RN, expect skin will begin to blister and peel soon to release the fluid; then evolve into patchy areas of full thickness skin loss as the blisters slough off. This is a typical progression with cellulitis within several days. --cont IV doxy and oral Levaquin  --wound care per order  # History of dilated cardiomyopathy # Status post PPM: Last ejection fraction was noted to be preserved at 55%.     # Hyponatremia:  Na 126 on presentation.  Likely due to poor hydration, improved to 134 with IVF.   Asymptomatic however.   --oral hydration now   #Hypokalemia:  --monitor and supplement PRN  # Diarrhea --started after starting IV clinda.  Pt reported having a hx of C diff. --current C diff neg --Imodium  PRN  # AKI on CKD 3b --Cr  1.21 on presentation, trended up to 1.57 this morning.  Improved with gentle MIVF --monitor   DVT prophylaxis: Lovenox  SQ Code Status: Full code  Family Communication: daughter updated at bedside today Level of care: Med-Surg Dispo:   The patient is from: home Anticipated d/c is to: home Anticipated d/c date is: tomorrow   Subjective and Interval History:  Pt reported feeling better.  Daughter reported pt was confused last night.  Wound RN consult today for large amt fluid palpated below the skin level.   Objective: Vitals:   06/29/24 0433 06/29/24 0745 06/29/24 0756 06/29/24 1448  BP: (!) 148/73 (!) 142/82 (!) 140/76 (!) 141/56  Pulse: 67  71 (!) 56  Resp: 18  17 17   Temp: 98.4 F (36.9 C)  98.7 F (37.1 C) 97.9 F (36.6 C)  TempSrc: Oral  Oral   SpO2: 99%  94% 100%  Weight:      Height:        Intake/Output Summary (Last 24 hours) at 06/29/2024 1714 Last data filed at 06/29/2024 1500 Gross per 24 hour  Intake 1904.84 ml  Output --  Net 1904.84 ml   Filed Weights   06/26/24 1857 06/26/24 2206  Weight: 64 kg 66.8 kg    Examination:   Constitutional: NAD, AAOx3 HEENT: conjunctivae and lids normal, EOMI CV: No cyanosis.   RESP: normal respiratory effort, on RA Extremities: No effusions, edema in BLE SKIN: left forearm erythema improved, however, now has large areas of fluid buildup underneath the skin Neuro: II -  XII grossly intact.   Psych: Normal mood and affect.  Appropriate judgement and reason   Data Reviewed: I have personally reviewed labs and imaging studies  Time spent: 35 minutes  Ellouise Haber, MD [facility] If 7PM-7AM, please contact night-coverage 06/29/2024, 5:14 PM

## 2024-06-29 NOTE — Progress Notes (Addendum)
 Mobility Specialist - Progress Note   06/29/24 1315  Mobility  Activity Ambulated with assistance  Level of Assistance Contact guard assist, steadying assist  Assistive Device Front wheel walker  Distance Ambulated (ft) 24 ft  Activity Response Tolerated well  Mobility visit 1 Mobility  Mobility Specialist Start Time (ACUTE ONLY) 1131  Mobility Specialist Stop Time (ACUTE ONLY) 1143  Mobility Specialist Time Calculation (min) (ACUTE ONLY) 12 min   Pt supine upon entry, utilizing RA. Pt completed bed mob MinA, STS to RW and amb to/from the bathroom CGA--- completed peri care indep. Pt amb around the bed towards the recliner CGA. Pt left seated alarm set and needs within reach. Family member present at bedside.  Michele Montgomery Mobility Specialist 06/29/24 1:19 PM

## 2024-06-29 NOTE — Plan of Care (Signed)
 Pt stated she is feeling better overnight today. No longer experiencing diarrhea. Pt did seems to be more confused at the earlier in the shift but much more clear this morning.    Problem: Education: Goal: Knowledge of General Education information will improve Description: Including pain rating scale, medication(s)/side effects and non-pharmacologic comfort measures Outcome: Progressing   Problem: Health Behavior/Discharge Planning: Goal: Ability to manage health-related needs will improve Outcome: Progressing   Problem: Clinical Measurements: Goal: Ability to maintain clinical measurements within normal limits will improve Outcome: Progressing Goal: Will remain free from infection Outcome: Progressing Goal: Diagnostic test results will improve Outcome: Progressing Goal: Respiratory complications will improve Outcome: Progressing Goal: Cardiovascular complication will be avoided Outcome: Progressing   Problem: Activity: Goal: Risk for activity intolerance will decrease Outcome: Progressing   Problem: Nutrition: Goal: Adequate nutrition will be maintained Outcome: Progressing   Problem: Coping: Goal: Level of anxiety will decrease Outcome: Progressing   Problem: Elimination: Goal: Will not experience complications related to bowel motility Outcome: Progressing Goal: Will not experience complications related to urinary retention Outcome: Progressing   Problem: Pain Managment: Goal: General experience of comfort will improve and/or be controlled Outcome: Progressing   Problem: Safety: Goal: Ability to remain free from injury will improve Outcome: Progressing   Problem: Skin Integrity: Goal: Risk for impaired skin integrity will decrease Outcome: Progressing   Problem: Clinical Measurements: Goal: Ability to avoid or minimize complications of infection will improve Outcome: Progressing   Problem: Skin Integrity: Goal: Skin integrity will improve Outcome:  Progressing

## 2024-06-30 DIAGNOSIS — L03114 Cellulitis of left upper limb: Secondary | ICD-10-CM | POA: Diagnosis not present

## 2024-06-30 LAB — BASIC METABOLIC PANEL WITH GFR
Anion gap: 6 (ref 5–15)
BUN: 13 mg/dL (ref 8–23)
CO2: 20 mmol/L — ABNORMAL LOW (ref 22–32)
Calcium: 8.4 mg/dL — ABNORMAL LOW (ref 8.9–10.3)
Chloride: 108 mmol/L (ref 98–111)
Creatinine, Ser: 1.21 mg/dL — ABNORMAL HIGH (ref 0.44–1.00)
GFR, Estimated: 44 mL/min — ABNORMAL LOW (ref >60)
Glucose, Bld: 97 mg/dL (ref 70–99)
Potassium: 4.1 mmol/L (ref 3.5–5.1)
Sodium: 134 mmol/L — ABNORMAL LOW (ref 135–145)

## 2024-06-30 MED ORDER — IRBESARTAN 150 MG PO TABS
300.0000 mg | ORAL_TABLET | Freq: Every day | ORAL | Status: DC
  Administered 2024-07-01 – 2024-07-02 (×2): 300 mg via ORAL
  Filled 2024-06-30 (×2): qty 2

## 2024-06-30 MED ORDER — IRBESARTAN 150 MG PO TABS
150.0000 mg | ORAL_TABLET | Freq: Every day | ORAL | Status: DC
  Administered 2024-06-30: 150 mg via ORAL
  Filled 2024-06-30: qty 1

## 2024-06-30 MED ORDER — IRBESARTAN 150 MG PO TABS
150.0000 mg | ORAL_TABLET | Freq: Once | ORAL | Status: AC
Start: 2024-06-30 — End: 2024-06-30
  Administered 2024-06-30: 150 mg via ORAL
  Filled 2024-06-30: qty 1

## 2024-06-30 NOTE — Plan of Care (Signed)

## 2024-06-30 NOTE — Plan of Care (Signed)
   Problem: Education: Goal: Knowledge of General Education information will improve Description: Including pain rating scale, medication(s)/side effects and non-pharmacologic comfort measures Outcome: Progressing   Problem: Clinical Measurements: Goal: Diagnostic test results will improve Outcome: Progressing   Problem: Activity: Goal: Risk for activity intolerance will decrease Outcome: Progressing   Problem: Pain Managment: Goal: General experience of comfort will improve and/or be controlled Outcome: Progressing

## 2024-06-30 NOTE — Progress Notes (Signed)
 PROGRESS NOTE    Michele Montgomery  FMW:[id number] DOB: 06-Sep-1941 DOA: 06/26/2024 PCP: Michele Reyes BIRCH, MD  145A/145A-AA  LOS: 4 days   Brief hospital course:   Assessment & Plan: Michele Montgomery is a 83 y.o. female with medical history significant of dilated cardiomyopathy status post PPM in 2011 last interrogated in May 2025, last documented ejection fraction was 55% on stress echo in 11/2023.  Hypertension, hyperlipidemia and chronic kidney disease stage IIIa   Patient has been in her usual state of health until couple of days ago when she was bitten by her cat.  Over the past couple of days, site of bite has evolved into swelling and significant erythema.    # Acute left upper extremity cellulitis 2/2 Cat bite --Most common causative organism includes Pasteurella.  Augmentin would have been preferable however due to documented allergy to penicillin, second line treatment was initiated with clindamycin . --discussed with pt and daughter, and they refused to challenge allergy with Augmentin. --after discussing with oncall ID provider, abx switched from clinda to doxy and levaquin . --Large amt fluid palpated below the skin level on 9/15.  Per wound care RN, expect skin will begin to blister and peel soon to release the fluid; then evolve into patchy areas of full thickness skin loss as the blisters slough off. This is a typical progression with cellulitis within several days. --left arm cellulitis looked improved today --cont IV doxy and oral Levaquin  --wound care per order --can discharge on oral doxy and levaquin  to finish 7-10 day course.  # History of dilated cardiomyopathy # Status post PPM: Last ejection fraction was noted to be preserved at 55%.     # Hyponatremia:  Na 126 on presentation.  Likely due to poor hydration, improved to 134 with IVF.   Asymptomatic however.   --oral hydration now   #Hypokalemia:  --monitor and supplement PRN  # Diarrhea --started after  starting IV clinda.  Pt reported having a hx of C diff. --current C diff neg --Imodium  PRN  # AKI on CKD 3b --Cr 1.21 on presentation, trended up to 1.57.  Improved with gentle MIVF.  Cr back to baseline 1.21 today. --oral hydration.  # HTN --BP trending up --cont home coreg  --increase irbesartan  to 300 mg daily  Chronic benzo use --takes 0.5 mg nightly   DVT prophylaxis: Lovenox  SQ Code Status: Full code  Family Communication: daughter updated at bedside today Level of care: Med-Surg Dispo:   The patient is from: home Anticipated d/c is to: home Anticipated d/c date is: tomorrow   Subjective and Interval History:  Pt complained of tightness and hand swelling from ACE wrap over her left forearm.  ACE unwrapped, and re-dressed with kerlex.     Objective: Vitals:   06/30/24 1126 06/30/24 1557 06/30/24 1811 06/30/24 1815  BP: (!) 150/58 (!) 177/69 (!) 184/74 (!) 184/74  Pulse: 65     Resp: 18 20    Temp: 98.3 F (36.8 C) 98.5 F (36.9 C)    TempSrc: Oral Oral    SpO2: 98% 100%    Weight:      Height:        Intake/Output Summary (Last 24 hours) at 06/30/2024 1909 Last data filed at 06/30/2024 1300 Gross per 24 hour  Intake 390 ml  Output --  Net 390 ml   Filed Weights   06/26/24 1857 06/26/24 2206  Weight: 64 kg 66.8 kg    Examination:   Constitutional: NAD, AAOx3 HEENT:  conjunctivae and lids normal, EOMI CV: No cyanosis.   RESP: normal respiratory effort, on RA Extremities: No effusions, edema in BLE SKIN: left arm erythema almost resolved, edema improved except for hand was swollen Neuro: II - XII grossly intact.   Psych: Normal mood and affect.  Appropriate judgement and reason   Data Reviewed: I have personally reviewed labs and imaging studies  Time spent: 50 minutes  Ellouise Haber, MD [facility] If 7PM-7AM, please contact night-coverage 06/30/2024, 7:09 PM

## 2024-06-30 NOTE — Progress Notes (Signed)
 Mobility Specialist Progress Note:    06/30/24 1633  Mobility  Activity Ambulated with assistance  Level of Assistance Contact guard assist, steadying assist  Assistive Device Front wheel walker  Distance Ambulated (ft) 50 ft  Range of Motion/Exercises Active;All extremities  Activity Response Tolerated well  Mobility visit 1 Mobility  Mobility Specialist Start Time (ACUTE ONLY) 1613  Mobility Specialist Stop Time (ACUTE ONLY) 1633  Mobility Specialist Time Calculation (min) (ACUTE ONLY) 20 min   Agreeable to mobility, required CGA to stand and supervision to ambulate with RW. Tolerated well, asx throughout. Returned supine, alarm on. All needs met.   Sherrilee Ditty Mobility Specialist Please contact via Special educational needs teacher or  Rehab office at [phone]

## 2024-07-01 DIAGNOSIS — L03114 Cellulitis of left upper limb: Secondary | ICD-10-CM | POA: Diagnosis not present

## 2024-07-01 LAB — BASIC METABOLIC PANEL WITH GFR
Anion gap: 9 (ref 5–15)
BUN: 10 mg/dL (ref 8–23)
CO2: 21 mmol/L — ABNORMAL LOW (ref 22–32)
Calcium: 8.5 mg/dL — ABNORMAL LOW (ref 8.9–10.3)
Chloride: 106 mmol/L (ref 98–111)
Creatinine, Ser: 1.11 mg/dL — ABNORMAL HIGH (ref 0.44–1.00)
GFR, Estimated: 49 mL/min — ABNORMAL LOW (ref >60)
Glucose, Bld: 101 mg/dL — ABNORMAL HIGH (ref 70–99)
Potassium: 3.7 mmol/L (ref 3.5–5.1)
Sodium: 136 mmol/L (ref 135–145)

## 2024-07-01 LAB — CULTURE, BLOOD (ROUTINE X 2)
Culture: NO GROWTH
Culture: NO GROWTH

## 2024-07-01 MED ORDER — BOOST PLUS PO LIQD
237.0000 mL | Freq: Three times a day (TID) | ORAL | Status: DC
  Administered 2024-07-01 – 2024-07-02 (×3): 237 mL via ORAL

## 2024-07-01 MED ORDER — LEVOFLOXACIN 250 MG PO TABS
250.0000 mg | ORAL_TABLET | Freq: Every day | ORAL | Status: DC
  Administered 2024-07-02: 250 mg via ORAL
  Filled 2024-07-01: qty 1

## 2024-07-01 NOTE — Progress Notes (Signed)
 PROGRESS NOTE    Michele Montgomery  FMW:[id number] DOB: [birth year] DOA: 06/26/2024 PCP: Auston Reyes BIRCH, MD  145A/145A-AA  LOS: 5 days   Brief hospital course:   Assessment & Plan: Michele Montgomery is a 83 y.o. female with medical history significant of dilated cardiomyopathy status post PPM in 2011 last interrogated in May 2025, last documented ejection fraction was 55% on stress echo in 11/2023.  Hypertension, hyperlipidemia and chronic kidney disease stage IIIa   Patient has been in her usual state of health until couple of days ago when she was bitten by her cat.  Over the past couple of days, site of bite has evolved into swelling and significant erythema.    # Acute left upper extremity cellulitis 2/2 Cat bite --Most common causative organism includes Pasteurella.  Augmentin would have been preferable however due to documented allergy to penicillin, second line treatment was initiated with clindamycin . --discussed with pt and daughter, and they refused to challenge allergy with Augmentin. --after discussing with oncall ID provider, abx switched from clinda to doxy and levaquin . --Large amt fluid palpated below the skin level on 9/15.  Per wound care RN, expect skin will begin to blister and peel soon to release the fluid; then evolve into patchy areas of full thickness skin loss as the blisters slough off. This is a typical progression with cellulitis within several days. --left arm cellulitis looked improved today --cont PO doxy and oral Levaquin  --wound care per order --Patient lives alone and her daughter works full-time as a Charity fundraiser and will not be able to assist with dressing changes or with taking care of her during the day.  PT consulted --can discharge on oral doxy and levaquin  to finish 7-10 day course.  # History of dilated cardiomyopathy # Status post PPM: Last ejection fraction was noted to be preserved at 55%.     # Hyponatremia:  Na 126 on presentation.  Likely  due to poor hydration, improved to 134 with IVF.  Resolved  #Hypokalemia:  -- Resolved  # Diarrhea --started after starting IV clinda.  Pt reported having a hx of C diff. --current C diff neg --Imodium  PRN  # AKI on CKD 3b -- Creatinine back to baseline. -- Continue to monitor  # HTN --BP elevated --cont home coreg  -- Continue irbesartan  to 300 mg daily, increased from home dose this hospitalization  Chronic benzodiazepine use --takes 0.5 mg nightly, will continue this   DVT prophylaxis: Lovenox  SQ Code Status: Full code  Family Communication: daughter updated at bedside today Level of care: Med-Surg Dispo:   The patient is from: home Anticipated d/c is to: home Anticipated d/c date is: tomorrow, pending PT evaluation   Subjective and Interval History:  No acute events overnight.  Objective: Vitals:   07/01/24 0808 07/01/24 1052 07/01/24 1137 07/01/24 1538  BP: (!) 186/66 (!) 129/59 (!) 145/81 (!) 151/55  Pulse: 71 84  79  Resp: 17   18  Temp: 97.9 F (36.6 C)   98.5 F (36.9 C)  TempSrc: Oral     SpO2: 100%   97%  Weight:      Height:        Intake/Output Summary (Last 24 hours) at 07/01/2024 1815 Last data filed at 07/01/2024 1518 Gross per 24 hour  Intake 240 ml  Output --  Net 240 ml   Filed Weights   06/26/24 1857 06/26/24 2206  Weight: 64 kg 66.8 kg    Examination:   Constitutional: In  no distress.  Cardiovascular: Normal rate, regular rhythm. No lower extremity edema  Pulmonary: Non labored breathing on room air, no wheezing or rales.   Abdominal: Soft. Non distended and non tender Musculoskeletal: Left arm bandage clean dry and intact Neurological: Alert and oriented to person, place, and time. Non focal  Skin: Skin is warm and dry.   Data Reviewed: I have personally reviewed labs and imaging studies  Time spent: 35 minutes  Alban Pepper, MD [facility] If 7PM-7AM, please contact night-coverage 07/01/2024, 6:15 PM

## 2024-07-01 NOTE — Progress Notes (Signed)
 Mobility Specialist Progress Note:    07/01/24 1235  Mobility  Activity Ambulated with assistance  Level of Assistance Contact guard assist, steadying assist  Assistive Device None  Distance Ambulated (ft) 200 ft  Range of Motion/Exercises Active;All extremities  Activity Response Tolerated well  Mobility visit 1 Mobility  Mobility Specialist Start Time (ACUTE ONLY) 1221  Mobility Specialist Stop Time (ACUTE ONLY) 1236  Mobility Specialist Time Calculation (min) (ACUTE ONLY) 15 min   Pt received in bed, daughter in room. Agreeable to mobility, required CGA to stand and ambulate with no AD. Pt wanted to ambulate w/o RW, utilizes handrails. Tolerated well, fatigued at EOS. Returned supine, all needs met.  Sherrilee Ditty Mobility Specialist Please contact via Special educational needs teacher or  Rehab office at [phone]

## 2024-07-01 NOTE — Progress Notes (Signed)
 1126 Dressing to L arm changed per orders. Pt tolerated

## 2024-07-02 ENCOUNTER — Other Ambulatory Visit: Payer: Self-pay

## 2024-07-02 DIAGNOSIS — L03114 Cellulitis of left upper limb: Secondary | ICD-10-CM | POA: Diagnosis not present

## 2024-07-02 LAB — BASIC METABOLIC PANEL WITH GFR
Anion gap: 8 (ref 5–15)
BUN: 13 mg/dL (ref 8–23)
CO2: 21 mmol/L — ABNORMAL LOW (ref 22–32)
Calcium: 8.4 mg/dL — ABNORMAL LOW (ref 8.9–10.3)
Chloride: 107 mmol/L (ref 98–111)
Creatinine, Ser: 1.06 mg/dL — ABNORMAL HIGH (ref 0.44–1.00)
GFR, Estimated: 51 mL/min — ABNORMAL LOW (ref >60)
Glucose, Bld: 119 mg/dL — ABNORMAL HIGH (ref 70–99)
Potassium: 3.2 mmol/L — ABNORMAL LOW (ref 3.5–5.1)
Sodium: 136 mmol/L (ref 135–145)

## 2024-07-02 LAB — CBC
HCT: 29.1 % — ABNORMAL LOW (ref 36.0–46.0)
Hemoglobin: 9.6 g/dL — ABNORMAL LOW (ref 12.0–15.0)
MCH: 30.9 pg (ref 26.0–34.0)
MCHC: 33 g/dL (ref 30.0–36.0)
MCV: 93.6 fL (ref 80.0–100.0)
Platelets: 211 K/uL (ref 150–400)
RBC: 3.11 MIL/uL — ABNORMAL LOW (ref 3.87–5.11)
RDW: 13.5 % (ref 11.5–15.5)
WBC: 5.4 K/uL (ref 4.0–10.5)
nRBC: 0 % (ref 0.0–0.2)

## 2024-07-02 MED ORDER — LABETALOL HCL 5 MG/ML IV SOLN: 20.0000 mg | INTRAVENOUS | Status: DC | PRN

## 2024-07-02 MED ORDER — ASPIRIN 81 MG PO TBEC
81.0000 mg | DELAYED_RELEASE_TABLET | Freq: Every day | ORAL | 12 refills | Status: AC
  Filled 2024-07-02: qty 30, 30d supply, fill #0

## 2024-07-02 MED ORDER — DOXYCYCLINE HYCLATE 100 MG PO TABS
100.0000 mg | ORAL_TABLET | Freq: Two times a day (BID) | ORAL | 0 refills | Status: AC
  Filled 2024-07-02: qty 7, 4d supply, fill #0

## 2024-07-02 MED ORDER — CETIRIZINE HCL 5 MG PO TABS: 10.0000 mg | ORAL_TABLET | Freq: Every day | ORAL | Status: AC

## 2024-07-02 MED ORDER — HYDRALAZINE HCL 20 MG/ML IJ SOLN
10.0000 mg | Freq: Four times a day (QID) | INTRAMUSCULAR | Status: DC | PRN
  Filled 2024-07-02: qty 1

## 2024-07-02 MED ORDER — LEVOFLOXACIN 250 MG PO TABS
250.0000 mg | ORAL_TABLET | Freq: Every day | ORAL | 0 refills | Status: AC
  Filled 2024-07-02: qty 3, 3d supply, fill #0

## 2024-07-02 NOTE — Discharge Instructions (Addendum)
 You will discharge with antibiotics, please complete this course.   You will need to follow up with your primary care doctor about adjusting your blood pressure medicines.

## 2024-07-02 NOTE — Evaluation (Signed)
 Physical Therapy Evaluation Patient Details Name: Michele Montgomery MRN: [id number] DOB: [birth year] Today's Date: 07/02/2024  History of Present Illness  Pt is an 83 y/o F presenting to ED following a cat bite on her arm resulting in swelling, redness, pain. Workup for acute cellulitis of LUE. PMH signifcant for HTN, dilated cardiomyopathy s/p AICD, HLD, CKD-III.  Clinical Impression  Pt A&Ox4, agreeable to PT evaluation, denied pain throughout session. At baseline, pt reports being modI with mobility, intermittent RW use or furniture surfing for amb, denies hx of falls, IND with ADLs. Pt was met sitting in recliner, able to perform STS with supervision. Pt amb to bathroom with CGA, able to stand for brief management, perform pericare, and wash hands at sink with supervision. STS from toilet with supervision and unilateral UE support on bathroom rail. Pt amb ~164ft with no AD and CGA, pt intermittently reaching for rail for UE support during amb, but no LOB. Pt was educated on and receptive to use of RW for ambulation to minimize fall risk. Pt was returned to supine at end of session with supervision, all needs in reach, daughter at bedside. Pt would benefit from skilled PT intervention to address listed deficits ( see PT Problem List) and allow for safe return to PLOF.       If plan is discharge home, recommend the following: A little help with walking and/or transfers;A little help with bathing/dressing/bathroom;Assist for transportation;Help with stairs or ramp for entrance   Can travel by private vehicle        Equipment Recommendations None recommended by PT (pt has recommended DME)  Recommendations for Other Services       Functional Status Assessment Patient has had a recent decline in their functional status and demonstrates the ability to make significant improvements in function in a reasonable and predictable amount of time.     Precautions / Restrictions Precautions Precautions:  Fall;ICD/Pacemaker Recall of Precautions/Restrictions: Intact Restrictions Weight Bearing Restrictions Per Provider Order: No      Mobility  Bed Mobility Overal bed mobility: Needs Assistance Bed Mobility: Sit to Supine       Sit to supine: Supervision   General bed mobility comments: no physical assist required    Transfers Overall transfer level: Needs assistance Equipment used: None Transfers: Sit to/from Stand Sit to Stand: Supervision           General transfer comment: STS from recliner and toilet with supervision, use of arm rests/rail in bathroom to assist with rising    Ambulation/Gait Ambulation/Gait assistance: Contact guard assist Gait Distance (Feet): 150 Feet Assistive device: None Gait Pattern/deviations: Step-through pattern, Narrow base of support Gait velocity: decreased     General Gait Details: no LOB, decreased stride length bilaterally, occasionally reaching for rail for UE support  Stairs            Wheelchair Mobility     Tilt Bed    Modified Rankin (Stroke Patients Only)       Balance Overall balance assessment: Needs assistance Sitting-balance support: Feet supported Sitting balance-Leahy Scale: Normal     Standing balance support: No upper extremity supported Standing balance-Leahy Scale: Good Standing balance comment: able to stand for brief management and to wash hands at sink with steady balance                             Pertinent Vitals/Pain Pain Assessment Pain Assessment: No/denies pain    Home  Living Family/patient expects to be discharged to:: Private residence Living Arrangements: Alone Available Help at Discharge: Family;Available PRN/intermittently Type of Home: House Home Access: Stairs to enter Entrance Stairs-Rails: Left Entrance Stairs-Number of Steps: 2   Home Layout: One level Home Equipment: Agricultural consultant (2 wheels);Cane - single point      Prior Function Prior Level of  Function : Independent/Modified Independent;Working/employed             Mobility Comments: Pt reports being modI with mobility, intermittent RW use or furniture surfing in home, denies falls ADLs Comments: IND with ADLs     Extremity/Trunk Assessment   Upper Extremity Assessment Upper Extremity Assessment: Generalized weakness (LUE wrapped, grip strength WFL bilaterally)    Lower Extremity Assessment Lower Extremity Assessment: Generalized weakness       Communication   Communication Communication: No apparent difficulties    Cognition Arousal: Alert Behavior During Therapy: WFL for tasks assessed/performed   PT - Cognitive impairments: No apparent impairments                       PT - Cognition Comments: A&Ox4, pleasant Following commands: Intact       Cueing Cueing Techniques: Verbal cues, Visual cues     General Comments      Exercises Other Exercises Other Exercises: VSS throughout session, SpO2 98% on RA at end of session with HR in low 80s   Assessment/Plan    PT Assessment Patient needs continued PT services  PT Problem List Decreased activity tolerance;Decreased balance;Decreased mobility;Decreased knowledge of use of DME;Decreased safety awareness       PT Treatment Interventions DME instruction;Gait training;Stair training;Functional mobility training;Therapeutic activities;Therapeutic exercise;Balance training;Neuromuscular re-education;Patient/family education    PT Goals (Current goals can be found in the Care Plan section)  Acute Rehab PT Goals Patient Stated Goal: to go home PT Goal Formulation: With patient Time For Goal Achievement: 07/16/24 Potential to Achieve Goals: Good    Frequency Min 2X/week     Co-evaluation               AM-PAC PT 6 Clicks Mobility  Outcome Measure Help needed turning from your back to your side while in a flat bed without using bedrails?: None Help needed moving from lying on your  back to sitting on the side of a flat bed without using bedrails?: A Little Help needed moving to and from a bed to a chair (including a wheelchair)?: A Little Help needed standing up from a chair using your arms (e.g., wheelchair or bedside chair)?: None Help needed to walk in hospital room?: A Little Help needed climbing 3-5 steps with a railing? : A Little 6 Click Score: 20    End of Session Equipment Utilized During Treatment: Gait belt Activity Tolerance: Patient tolerated treatment well Patient left: in bed;with call bell/phone within reach;with bed alarm set;with family/visitor present Nurse Communication: Mobility status PT Visit Diagnosis: Unsteadiness on feet (R26.81);Other abnormalities of gait and mobility (R26.89);Muscle weakness (generalized) (M62.81)    Time: 9099-9084 PT Time Calculation (min) (ACUTE ONLY): 15 min   Charges:   PT Evaluation $PT Eval Low Complexity: 1 Low PT Treatments $Therapeutic Activity: 8-22 mins PT General Charges $$ ACUTE PT VISIT: 1 Visit         Janell Axe, SPT

## 2024-07-02 NOTE — Plan of Care (Signed)
  Problem: Education: Goal: Knowledge of General Education information will improve Description: Including pain rating scale, medication(s)/side effects and non-pharmacologic comfort measures Outcome: Progressing   Problem: Clinical Measurements: Goal: Diagnostic test results will improve Outcome: Progressing   Problem: Nutrition: Goal: Adequate nutrition will be maintained Outcome: Progressing   Problem: Coping: Goal: Level of anxiety will decrease Outcome: Progressing   Problem: Pain Managment: Goal: General experience of comfort will improve and/or be controlled Outcome: Progressing   Problem: Safety: Goal: Ability to remain free from injury will improve Outcome: Progressing

## 2024-07-03 DIAGNOSIS — L03114 Cellulitis of left upper limb: Secondary | ICD-10-CM | POA: Diagnosis not present

## 2024-07-03 DIAGNOSIS — I878 Other specified disorders of veins: Secondary | ICD-10-CM | POA: Diagnosis not present

## 2024-07-03 DIAGNOSIS — I129 Hypertensive chronic kidney disease with stage 1 through stage 4 chronic kidney disease, or unspecified chronic kidney disease: Secondary | ICD-10-CM | POA: Diagnosis not present

## 2024-07-03 DIAGNOSIS — F419 Anxiety disorder, unspecified: Secondary | ICD-10-CM | POA: Diagnosis not present

## 2024-07-03 DIAGNOSIS — N179 Acute kidney failure, unspecified: Secondary | ICD-10-CM | POA: Diagnosis not present

## 2024-07-03 DIAGNOSIS — Z950 Presence of cardiac pacemaker: Secondary | ICD-10-CM | POA: Diagnosis not present

## 2024-07-03 DIAGNOSIS — E538 Deficiency of other specified B group vitamins: Secondary | ICD-10-CM | POA: Diagnosis not present

## 2024-07-03 DIAGNOSIS — M810 Age-related osteoporosis without current pathological fracture: Secondary | ICD-10-CM | POA: Diagnosis not present

## 2024-07-03 DIAGNOSIS — E785 Hyperlipidemia, unspecified: Secondary | ICD-10-CM | POA: Diagnosis not present

## 2024-07-03 DIAGNOSIS — M4800 Spinal stenosis, site unspecified: Secondary | ICD-10-CM | POA: Diagnosis not present

## 2024-07-03 DIAGNOSIS — H9190 Unspecified hearing loss, unspecified ear: Secondary | ICD-10-CM | POA: Diagnosis not present

## 2024-07-03 DIAGNOSIS — Z604 Social exclusion and rejection: Secondary | ICD-10-CM | POA: Diagnosis not present

## 2024-07-03 DIAGNOSIS — Z85828 Personal history of other malignant neoplasm of skin: Secondary | ICD-10-CM | POA: Diagnosis not present

## 2024-07-03 DIAGNOSIS — I420 Dilated cardiomyopathy: Secondary | ICD-10-CM | POA: Diagnosis not present

## 2024-07-03 DIAGNOSIS — Z556 Problems related to health literacy: Secondary | ICD-10-CM | POA: Diagnosis not present

## 2024-07-03 DIAGNOSIS — K219 Gastro-esophageal reflux disease without esophagitis: Secondary | ICD-10-CM | POA: Diagnosis not present

## 2024-07-03 DIAGNOSIS — D631 Anemia in chronic kidney disease: Secondary | ICD-10-CM | POA: Diagnosis not present

## 2024-07-03 DIAGNOSIS — N1831 Chronic kidney disease, stage 3a: Secondary | ICD-10-CM | POA: Diagnosis not present

## 2024-07-03 DIAGNOSIS — M1990 Unspecified osteoarthritis, unspecified site: Secondary | ICD-10-CM | POA: Diagnosis not present

## 2024-07-03 DIAGNOSIS — F32A Depression, unspecified: Secondary | ICD-10-CM | POA: Diagnosis not present

## 2024-07-05 NOTE — Discharge Summary (Signed)
 Physician Discharge Summary   Patient: Michele Montgomery MRN: [id number] DOB: 05-22-1941  Admit date:     06/26/2024  Discharge date: 07/02/2024  Discharge Physician: Alban Pepper   PCP: Auston Michele BIRCH, MD   Recommendations at discharge:    Wound check with PCP  Needs antihypertensives adjusted  Recheck BMP to ensure potassium is normal  Discharge Diagnoses: Principal Problem:   Cellulitis  Resolved Problems:   * No resolved hospital problems. *  Hospital Course: Michele Montgomery is a 84 y.o. female with medical history significant of dilated cardiomyopathy status post PPM in 2011 last interrogated in May 2025, last documented ejection fraction was 55% on stress echo in 11/2023.  Hypertension, hyperlipidemia and chronic kidney disease stage IIIa   Patient has been in her usual state of health until couple of days ago when she was bitten by her cat.  Over the past couple of days, site of bite has evolved into swelling and significant erythema.  She was admitted for IV antibiotics.   Assessment and Plan: Acute LUE cellulitis 2/2 cat bite  Significantly improved by discharge. She will continue PO doxy and levaquin  to complete a 7-10d course of antibiotics. She will continue with dressing changes at home.   H/o DCM  S/p PPM  Last EF 55%, euvolemic on exam.   HTN On coreg  at valsartan at home. Her medications were increased during this hospitalization. Discussed with patient and daughter that we will d/c on her home regimen as her BP will likely improve once home. They were given instructions on how to take patient's blood pressure and will bring a log to her primary care physician for further titration of bp meds if necessary.   Hyponatremia:  Resovled.    Hypokalemia:  Will need BMP outpatient to ensure potassium is normalized.    Diarrhea Bowel movements slowed by discharge.  started after starting IV clinda.  Pt reported having a hx of C diff. --current C diff  neg --Imodium  PRN   AKI on CKD 3b -- Creatinine back to baseline.  Chronic benzodiazepine use --takes 0.5 mg nightly, it was continued.        Consultants: None Procedures performed: None  Disposition: Home health Diet recommendation:  Regular diet DISCHARGE MEDICATION: Allergies as of 07/02/2024       Reactions   Clarithromycin     Other reaction(s): Abdominal Pain   Penicillins Anaphylaxis   Has patient had a PCN reaction causing immediate rash, facial/tongue/throat swelling, SOB or lightheadedness with hypotension: Yes Has patient had a PCN reaction causing severe rash involving mucus membranes or skin necrosis: No Has patient had a PCN reaction that required hospitalization: Yes Has patient had a PCN reaction occurring within the last 10 years: No If all of the above answers are NO, then may proceed with Cephalosporin use.   Ciprofloxacin Nausea And Vomiting   Hydrochlorothiazide Nausea And Vomiting   Metronidazole Nausea And Vomiting   Other    METROIGOAZOLE BENZOATE   ?? Needs clarification.    Prednisone Hives        Medication List     STOP taking these medications    aspirin  81 MG tablet Replaced by: aspirin  EC 81 MG tablet   ondansetron  4 MG disintegrating tablet Commonly known as: Zofran  ODT   potassium chloride  10 MEQ tablet Commonly known as: KLOR-CON  M       TAKE these medications    acetaminophen  650 MG CR tablet Commonly known as: TYLENOL  Take 650 mg  by mouth every 8 (eight) hours as needed for pain.   aspirin  EC 81 MG tablet Take 1 tablet (81 mg total) by mouth at bedtime. Swallow whole. Replaces: aspirin  81 MG tablet   carvedilol  25 MG tablet Commonly known as: COREG  Take 25 mg by mouth 2 (two) times daily.   cetirizine  5 MG tablet Commonly known as: ZYRTEC  Take 2 tablets (10 mg total) by mouth daily. What changed: medication strength   cyanocobalamin  1000 MCG tablet Commonly known as: VITAMIN B12 Take 1,000 mcg by mouth  daily.   doxycycline  100 MG tablet Commonly known as: VIBRA -TABS Take 1 tablet (100 mg total) by mouth every 12 (twelve) hours for 7 doses.   levofloxacin  250 MG tablet Commonly known as: LEVAQUIN  Take 1 tablet (250 mg total) by mouth daily for 3 days.   LORazepam  0.5 MG tablet Commonly known as: ATIVAN  Take 0.5 mg by mouth 3 (three) times daily as needed for anxiety.   memantine 5 MG tablet Commonly known as: NAMENDA Take 5 mg by mouth 2 (two) times daily.   montelukast 10 MG tablet Commonly known as: SINGULAIR Take 10 mg by mouth at bedtime.   pantoprazole  40 MG tablet Commonly known as: PROTONIX  Take 1 tablet by mouth daily.   simvastatin  40 MG tablet Commonly known as: ZOCOR  Take 40 mg by mouth at bedtime.   valsartan 80 MG tablet Commonly known as: DIOVAN Take 80 mg by mouth daily.   venlafaxine XR 75 MG 24 hr capsule Commonly known as: EFFEXOR-XR Take 75 mg by mouth daily.   Vitamin D3 125 MCG (5000 UT) Caps Take 5,000 Units by mouth daily.               Discharge Care Instructions  (From admission, onward)           Start     Ordered   07/02/24 0000  Discharge wound care:       Comments: Change dressing daily. Apply abdominal pad over areas that are weeping. Wrap with kerlex to keep pad in place. If when removing dressing there is some resistance (ie., the dressing is stuck to the skin please place arm in warm water to make dressing easier to remove.   07/02/24 1430            Follow-up Information     Michele Montgomery, Michele BIRCH, MD. Schedule an appointment as soon as possible for a visit in 1 week(s).   Specialty: Internal Medicine Contact information: [address] [place] [address] [phone]                Discharge Exam: Michele Montgomery   06/26/24 1857 06/26/24 2206  Weight: 64 kg 66.8 kg   Physical Exam  Constitutional: In no distress.  Cardiovascular: Normal rate, regular rhythm. No lower extremity edema   Pulmonary: Non labored breathing on room air, no wheezing or rales.   Abdominal: Soft. Non distended and non tender Musculoskeletal: Normal range of motion.     Neurological: Alert and oriented to person, place, and time. Non focal  Skin: Skin is warm and dry. LUE minimal erythema and a small amount of skin breakdwon that is superficial    Condition at discharge: good  The results of significant diagnostics from this hospitalization (including imaging, microbiology, ancillary and laboratory) are listed below for reference.   Imaging Studies: US  Venous Img Upper Uni Left (DVT) Result Date: 06/29/2024 CLINICAL DATA:  Left upper extremity swelling and erythema due to a  recent cat bite. EXAM: LEFT UPPER EXTREMITY VENOUS DOPPLER ULTRASOUND TECHNIQUE: Gray-scale sonography with graded compression, as well as color Doppler and duplex ultrasound were performed to evaluate the upper extremity deep venous system from the level of the subclavian vein and including the jugular, axillary, basilic, radial, ulnar and upper cephalic vein. Spectral Doppler was utilized to evaluate flow at rest and with distal augmentation maneuvers. COMPARISON:  None Available. FINDINGS: Contralateral Subclavian Vein: Respiratory phasicity is normal and symmetric with the symptomatic side. No evidence of thrombus. Normal compressibility. Internal Jugular Vein: No evidence of thrombus. Normal compressibility, respiratory phasicity and response to augmentation. Subclavian Vein: No evidence of thrombus. Normal compressibility, respiratory phasicity and response to augmentation. Axillary Vein: No evidence of thrombus. Normal compressibility, respiratory phasicity and response to augmentation. Cephalic Vein: No evidence of thrombus. Normal compressibility, respiratory phasicity and response to augmentation. Basilic Vein: No evidence of thrombus. Normal compressibility, respiratory phasicity and response to augmentation. Brachial Veins: No  evidence of thrombus. Normal compressibility, respiratory phasicity and response to augmentation. Radial Veins: No evidence of thrombus. Normal compressibility, respiratory phasicity and response to augmentation. Ulnar Veins: No evidence of thrombus. Normal compressibility, respiratory phasicity and response to augmentation. Venous Reflux:  None visualized. Other Findings:  None visualized. IMPRESSION: No evidence of DVT within the left upper extremity. Electronically Signed   By: Wilkie Lent M.D.   On: 06/29/2024 07:36    Microbiology: Results for orders placed or performed during the hospital encounter of 06/26/24  Blood culture (routine x 2)     Status: None   Collection Time: 06/26/24  9:26 PM   Specimen: BLOOD  Result Value Ref Range Status   Specimen Description BLOOD BLOOD RIGHT ARM  Final   Special Requests   Final    BOTTLES DRAWN AEROBIC AND ANAEROBIC Blood Culture results may not be optimal due to an inadequate volume of blood received in culture bottles   Culture   Final    NO GROWTH 5 DAYS Performed at [facility], [address], [place], [place] [address]    Report Status 07/01/2024 FINAL  Final  Blood culture (routine x 2)     Status: None   Collection Time: 06/26/24  9:26 PM   Specimen: BLOOD  Result Value Ref Range Status   Specimen Description BLOOD BLOOD RIGHT HAND  Final   Special Requests   Final    BOTTLES DRAWN AEROBIC AND ANAEROBIC Blood Culture results may not be optimal due to an inadequate volume of blood received in culture bottles   Culture   Final    NO GROWTH 5 DAYS Performed at [facility], [address], [place], [place] [address]    Report Status 07/01/2024 FINAL  Final  C Difficile Quick Screen w PCR reflex     Status: None   Collection Time: 06/28/24  9:31 AM   Specimen: STOOL  Result Value Ref Range Status   C Diff antigen NEGATIVE NEGATIVE Final   C Diff toxin NEGATIVE NEGATIVE Final   C Diff interpretation No C.  difficile detected.  Final    Comment: Performed at [facility], [address] [place], [place] [address]    Labs: CBC: Recent Labs  Lab 06/29/24 0421 07/02/24 1029  WBC 10.2 5.4  HGB 10.0* 9.6*  HCT 29.8* 29.1*  MCV 93.7 93.6  PLT 150 211   Basic Metabolic Panel: Recent Labs  Lab 06/29/24 0421 06/30/24 0547 07/01/24 0926 07/02/24 1029  NA 134* 134* 136  136  K 3.7 4.1 3.7 3.2*  CL 103 108 106 107  CO2 20* 20* 21* 21*  GLUCOSE 104* 97 101* 119*  BUN 19 13 10 13   CREATININE 1.30* 1.21* 1.11* 1.06*  CALCIUM 8.3* 8.4* 8.5* 8.4*  MG 2.0  --   --   --    Liver Function Tests: No results for input(s): AST, ALT, ALKPHOS, BILITOT, PROT, ALBUMIN in the last 168 hours. CBG: No results for input(s): GLUCAP in the last 168 hours.  Discharge time spent: greater than 30 minutes.  Signed: Alban Pepper, MD [facility] 07/05/2024

## 2024-07-07 DIAGNOSIS — L03114 Cellulitis of left upper limb: Secondary | ICD-10-CM | POA: Diagnosis not present

## 2024-07-07 DIAGNOSIS — W5501XD Bitten by cat, subsequent encounter: Secondary | ICD-10-CM | POA: Diagnosis not present

## 2024-07-10 DIAGNOSIS — N1831 Chronic kidney disease, stage 3a: Secondary | ICD-10-CM | POA: Diagnosis not present

## 2024-07-10 DIAGNOSIS — I420 Dilated cardiomyopathy: Secondary | ICD-10-CM | POA: Diagnosis not present

## 2024-07-10 DIAGNOSIS — L03114 Cellulitis of left upper limb: Secondary | ICD-10-CM | POA: Diagnosis not present

## 2024-07-10 DIAGNOSIS — D631 Anemia in chronic kidney disease: Secondary | ICD-10-CM | POA: Diagnosis not present

## 2024-07-10 DIAGNOSIS — I129 Hypertensive chronic kidney disease with stage 1 through stage 4 chronic kidney disease, or unspecified chronic kidney disease: Secondary | ICD-10-CM | POA: Diagnosis not present

## 2024-07-10 DIAGNOSIS — N179 Acute kidney failure, unspecified: Secondary | ICD-10-CM | POA: Diagnosis not present

## 2024-07-10 DIAGNOSIS — H9190 Unspecified hearing loss, unspecified ear: Secondary | ICD-10-CM | POA: Diagnosis not present

## 2024-07-15 DIAGNOSIS — I10 Essential (primary) hypertension: Secondary | ICD-10-CM | POA: Diagnosis not present

## 2024-07-15 DIAGNOSIS — Z0000 Encounter for general adult medical examination without abnormal findings: Secondary | ICD-10-CM | POA: Diagnosis not present

## 2024-07-15 DIAGNOSIS — K219 Gastro-esophageal reflux disease without esophagitis: Secondary | ICD-10-CM | POA: Diagnosis not present

## 2024-07-15 DIAGNOSIS — D649 Anemia, unspecified: Secondary | ICD-10-CM | POA: Diagnosis not present

## 2024-07-15 DIAGNOSIS — E538 Deficiency of other specified B group vitamins: Secondary | ICD-10-CM | POA: Diagnosis not present

## 2024-07-15 DIAGNOSIS — E782 Mixed hyperlipidemia: Secondary | ICD-10-CM | POA: Diagnosis not present

## 2024-07-15 DIAGNOSIS — N183 Chronic kidney disease, stage 3 unspecified: Secondary | ICD-10-CM | POA: Diagnosis not present

## 2024-07-15 DIAGNOSIS — Z1331 Encounter for screening for depression: Secondary | ICD-10-CM | POA: Diagnosis not present

## 2024-07-15 DIAGNOSIS — Z23 Encounter for immunization: Secondary | ICD-10-CM | POA: Diagnosis not present

## 2024-07-15 DIAGNOSIS — Z79899 Other long term (current) drug therapy: Secondary | ICD-10-CM | POA: Diagnosis not present

## 2024-07-22 DIAGNOSIS — I10 Essential (primary) hypertension: Secondary | ICD-10-CM | POA: Diagnosis not present

## 2024-07-22 DIAGNOSIS — Z950 Presence of cardiac pacemaker: Secondary | ICD-10-CM | POA: Diagnosis not present

## 2024-07-22 DIAGNOSIS — N1832 Chronic kidney disease, stage 3b: Secondary | ICD-10-CM | POA: Diagnosis not present

## 2024-07-22 DIAGNOSIS — F0154 Vascular dementia, unspecified severity, with anxiety: Secondary | ICD-10-CM | POA: Diagnosis not present

## 2024-07-22 DIAGNOSIS — K219 Gastro-esophageal reflux disease without esophagitis: Secondary | ICD-10-CM | POA: Diagnosis not present

## 2024-07-22 DIAGNOSIS — D631 Anemia in chronic kidney disease: Secondary | ICD-10-CM | POA: Diagnosis not present

## 2024-07-22 DIAGNOSIS — E785 Hyperlipidemia, unspecified: Secondary | ICD-10-CM | POA: Diagnosis not present

## 2024-07-22 DIAGNOSIS — R8290 Unspecified abnormal findings in urine: Secondary | ICD-10-CM | POA: Diagnosis not present

## 2024-07-29 DIAGNOSIS — N189 Chronic kidney disease, unspecified: Secondary | ICD-10-CM | POA: Diagnosis not present

## 2024-07-29 DIAGNOSIS — I129 Hypertensive chronic kidney disease with stage 1 through stage 4 chronic kidney disease, or unspecified chronic kidney disease: Secondary | ICD-10-CM | POA: Diagnosis not present

## 2024-07-29 DIAGNOSIS — N1831 Chronic kidney disease, stage 3a: Secondary | ICD-10-CM | POA: Diagnosis not present

## 2024-08-14 DIAGNOSIS — Z79899 Other long term (current) drug therapy: Secondary | ICD-10-CM | POA: Diagnosis not present

## 2024-08-19 DIAGNOSIS — Z950 Presence of cardiac pacemaker: Secondary | ICD-10-CM | POA: Diagnosis not present

## 2024-09-21 DIAGNOSIS — E782 Mixed hyperlipidemia: Secondary | ICD-10-CM | POA: Diagnosis not present

## 2024-09-21 DIAGNOSIS — I420 Dilated cardiomyopathy: Secondary | ICD-10-CM | POA: Diagnosis not present

## 2024-09-21 DIAGNOSIS — Z95810 Presence of automatic (implantable) cardiac defibrillator: Secondary | ICD-10-CM | POA: Diagnosis not present

## 2024-09-21 DIAGNOSIS — N183 Chronic kidney disease, stage 3 unspecified: Secondary | ICD-10-CM | POA: Diagnosis not present

## 2024-09-21 DIAGNOSIS — I10 Essential (primary) hypertension: Secondary | ICD-10-CM | POA: Diagnosis not present

## 2024-10-21 ENCOUNTER — Other Ambulatory Visit: Payer: Self-pay | Admitting: Internal Medicine

## 2024-10-21 DIAGNOSIS — Z1231 Encounter for screening mammogram for malignant neoplasm of breast: Secondary | ICD-10-CM

## 2024-11-18 ENCOUNTER — Encounter

## 2024-11-19 ENCOUNTER — Inpatient Hospital Stay: Admission: RE | Admit: 2024-11-19 | Source: Ambulatory Visit

## 2025-01-19 ENCOUNTER — Encounter: Admitting: Dermatology
